# Patient Record
Sex: Female | Born: 1985
Health system: Southern US, Community
[De-identification: ages and names within clinical notes are randomized; demographics above are authoritative.]

## PROBLEM LIST (undated history)

## (undated) DIAGNOSIS — F419 Anxiety disorder, unspecified: Secondary | ICD-10-CM

## (undated) DIAGNOSIS — B019 Varicella without complication: Secondary | ICD-10-CM

## (undated) DIAGNOSIS — I10 Essential (primary) hypertension: Secondary | ICD-10-CM

## (undated) HISTORY — DX: Anxiety disorder, unspecified: F41.9

## (undated) HISTORY — PX: NO PAST SURGERIES: SHX2092

## (undated) HISTORY — DX: Varicella without complication: B01.9

## (undated) HISTORY — DX: Essential (primary) hypertension: I10

---

## 2014-10-20 ENCOUNTER — Emergency Department: Payer: Self-pay | Admitting: Emergency Medicine

## 2016-04-11 ENCOUNTER — Ambulatory Visit (INDEPENDENT_AMBULATORY_CARE_PROVIDER_SITE_OTHER): Payer: BLUE CROSS/BLUE SHIELD

## 2016-04-11 ENCOUNTER — Encounter: Payer: Self-pay | Admitting: Family Medicine

## 2016-04-11 ENCOUNTER — Ambulatory Visit (INDEPENDENT_AMBULATORY_CARE_PROVIDER_SITE_OTHER): Payer: BLUE CROSS/BLUE SHIELD | Admitting: Family Medicine

## 2016-04-11 VITALS — BP 141/91 | HR 100 | Temp 98.1°F | Ht 68.0 in | Wt 257.1 lb

## 2016-04-11 DIAGNOSIS — M5441 Lumbago with sciatica, right side: Secondary | ICD-10-CM | POA: Insufficient documentation

## 2016-04-11 MED ORDER — PREDNISONE 10 MG (48) PO TBPK: ORAL_TABLET | ORAL | 0 refills | Status: DC

## 2016-04-11 NOTE — Patient Instructions (Signed)
Take the medication as prescribed.  We will call with your results.  If you fail to improve, please let me know.  Follow up annually  Take care  Dr. Adriana Simasook

## 2016-04-11 NOTE — Assessment & Plan Note (Signed)
New problem. Obtaining xray and treating with prednisone taper.

## 2016-04-11 NOTE — Progress Notes (Signed)
   Subjective:  Patient ID: Heather Beck, female    DOB: [birth year]  Age: 30 y.o. MRN: [id number]  CC: Low back pain  HPI Heather AdolphSierra Battie is a 30 y.o. female presents to the clinic today as a new patient with the above complaint.  Low back pain  Patient reports a 3 week history of right sided low back pain.  Pain started after lifting at work.   Mild in severity.  She reports associated radiation down her right leg.  She has been treated by a nurse practitioner at her work. She was given a muscle relaxant. She's had little improvement with this. She's taken some over-the-counter ibuprofen without improvement.  She reports that her pain is worse in the morning and it night. Improves with activity.  No other associated symptoms. No other complaints at this time.  PMH, Surgical Hx, Family Hx, Social History reviewed and updated as below.  Past Medical History:  Diagnosis Date  . Chicken pox    Past Surgical History:  Procedure Laterality Date  . NO PAST SURGERIES     Family History  Problem Relation Age of Onset  . Pancreatic cancer Maternal Aunt   . Bladder Cancer Maternal Uncle    Social History  Substance Use Topics  . Smoking status: Current Every Day Smoker  . Smokeless tobacco: Never Used  . Alcohol use Yes   Review of Systems  Musculoskeletal: Positive for back pain.  All other systems reviewed and are negative.  Objective:   Today's Vitals: BP (!) 141/91 (BP Location: Right Arm, Patient Position: Sitting, Cuff Size: Large)   Pulse 100   Temp 98.1 F (36.7 C) (Oral)   Ht 5\' 8"  (1.727 m)   Wt 257 lb 2 oz (116.6 kg)   LMP 03/21/2016 (Approximate)   SpO2 100%   BMI 39.10 kg/m   Physical Exam  Constitutional: She is oriented to person, place, and time. She appears well-developed. No distress.  HENT:  Head: Normocephalic and atraumatic.  Mouth/Throat: Oropharynx is clear and moist.  Eyes: Conjunctivae are normal. No scleral icterus.  Neck: Neck  supple.  Cardiovascular: Normal rate and regular rhythm.   Pulmonary/Chest: Effort normal. She has no wheezes. She has no rales.  Abdominal: Soft. She exhibits no distension. There is no tenderness. There is no rebound and no guarding.  Musculoskeletal:  Low back - nontender to palpation. No evidence of spasm. Negative straight leg raise.  Neurological: She is alert and oriented to person, place, and time.  Skin: Skin is warm and dry. No rash noted.  Psychiatric: She has a normal mood and affect.  Vitals reviewed.  Assessment & Plan:   Problem List Items Addressed This Visit    Right-sided low back pain with right-sided sciatica - Primary    New problem. Obtaining xray and treating with prednisone taper.      Relevant Medications   predniSONE (STERAPRED UNI-PAK 48 TAB) 10 MG (48) TBPK tablet   Other Relevant Orders   DG Lumbar Spine 2-3 Views    Other Visit Diagnoses   None.     Outpatient Encounter Prescriptions as of 04/11/2016  Medication Sig  . predniSONE (STERAPRED UNI-PAK 48 TAB) 10 MG (48) TBPK tablet Per package instructions.   No facility-administered encounter medications on file as of 04/11/2016.     Follow-up: Annually/sooner if needed.   Everlene OtherJayce Esbeidy Mclaine DO [facility] [place]

## 2016-04-11 NOTE — Progress Notes (Signed)
Pre visit review using our clinic review tool, if applicable. No additional management support is needed unless otherwise documented below in the visit note. 

## 2016-05-11 ENCOUNTER — Emergency Department
Admission: EM | Admit: 2016-05-11 | Discharge: 2016-05-11 | Disposition: A | Discharge status: Home / Self Care | Payer: Worker's Compensation | Attending: Emergency Medicine | Admitting: Emergency Medicine

## 2016-05-11 ENCOUNTER — Encounter: Payer: Self-pay | Admitting: Emergency Medicine

## 2016-05-11 DIAGNOSIS — M5441 Lumbago with sciatica, right side: Secondary | ICD-10-CM | POA: Diagnosis not present

## 2016-05-11 DIAGNOSIS — F17200 Nicotine dependence, unspecified, uncomplicated: Secondary | ICD-10-CM | POA: Diagnosis not present

## 2016-05-11 DIAGNOSIS — M5431 Sciatica, right side: Secondary | ICD-10-CM

## 2016-05-11 DIAGNOSIS — M545 Low back pain: Secondary | ICD-10-CM | POA: Diagnosis present

## 2016-05-11 MED ORDER — TRAMADOL HCL 50 MG PO TABS: 50.0000 mg | ORAL_TABLET | Freq: Four times a day (QID) | ORAL | 0 refills | Status: DC | PRN

## 2016-05-11 MED ORDER — CYCLOBENZAPRINE HCL 10 MG PO TABS: 10.0000 mg | ORAL_TABLET | Freq: Three times a day (TID) | ORAL | 0 refills | Status: DC | PRN

## 2016-05-11 MED ORDER — PREDNISONE 10 MG (21) PO TBPK: ORAL_TABLET | ORAL | 0 refills | Status: DC

## 2016-05-11 NOTE — ED Notes (Signed)
Pt stating that she "has really bad lower back pain." Pt was "hurt on the job about a month ago." Pt was prescribed Prednisone. Pt stating that the pain comes and goes. Pt stating that she was in a lot of pain on waking this morning when trying to stand. Pain shoots down right leg. Pt stating that she also experiences pain when turning in bed at night.

## 2016-05-11 NOTE — ED Provider Notes (Signed)
[facility] Emergency Department Provider Note ____________________________________________  Time seen: Approximately 10:04 AM  I have reviewed the triage vital signs and the nursing notes.   HISTORY  Chief Complaint Back Pain    HPI Dow AdolphSierra Giannone is a 30 y.o. female who presents to the emergency department for evaluation of back pain that started about a month ago. Pain increases with ambulation and turning over in bed. Pain radiates down with leg. She was given prednisone about a month ago and had relief of pain until yesterday.  Past Medical History:  Diagnosis Date  . Chicken pox     Patient Active Problem List   Diagnosis Date Noted  . Right-sided low back pain with right-sided sciatica 04/11/2016    Past Surgical History:  Procedure Laterality Date  . NO PAST SURGERIES      Prior to Admission medications   Medication Sig Start Date End Date Taking? Authorizing Provider  cyclobenzaprine (FLEXERIL) 10 MG tablet Take 1 tablet (10 mg total) by mouth 3 (three) times daily as needed for muscle spasms. 05/11/16   Chinita Pesterari B Tayshaun Kroh, FNP  predniSONE (STERAPRED UNI-PAK 21 TAB) 10 MG (21) TBPK tablet Take 6 tablets on day 1 Take 5 tablets on day 2 Take 4 tablets on day 3 Take 3 tablets on day 4 Take 2 tablets on day 5 Take 1 tablet on day 6 05/11/16   Micheala Morissette B Tanyiah Laurich, FNP  traMADol (ULTRAM) 50 MG tablet Take 1 tablet (50 mg total) by mouth every 6 (six) hours as needed. 05/11/16   Chinita Pesterari B Ashlei Chinchilla, FNP    Allergies Review of patient's allergies indicates no known allergies.  Family History  Problem Relation Age of Onset  . Pancreatic cancer Maternal Aunt   . Bladder Cancer Maternal Uncle     Social History Social History  Substance Use Topics  . Smoking status: Current Every Day Smoker  . Smokeless tobacco: Never Used  . Alcohol use Yes    Review of Systems Constitutional: No recent illness. Cardiovascular: Denies chest pain or  palpitations. Respiratory: Denies shortness of breath. Musculoskeletal: Pain in lower back with radiation into right leg. Skin: Negative for rash, wound, lesion. Neurological: Negative for focal weakness or numbness.  ____________________________________________   PHYSICAL EXAM:  VITAL SIGNS: ED Triage Vitals  Enc Vitals Group     BP 05/11/16 0921 140/74     Pulse Rate 05/11/16 0921 94     Resp 05/11/16 0921 18     Temp 05/11/16 0921 98.7 F (37.1 C)     Temp Source 05/11/16 0921 Oral     SpO2 05/11/16 0921 100 %     Weight 05/11/16 0920 250 lb (113.4 kg)     Height 05/11/16 0920 5\' 8"  (1.727 m)     Head Circumference --      Peak Flow --      Pain Score 05/11/16 0920 10     Pain Loc --      Pain Edu? --      Excl. in GC? --     Constitutional: Alert and oriented. Well appearing and in no acute distress. Eyes: Conjunctivae are normal. EOMI. Head: Atraumatic. Neck: No stridor.  Respiratory: Normal respiratory effort.   Musculoskeletal: Full ROM. Straight leg raise positive at 45*.  Neurologic:  Normal speech and language. No gross focal neurologic deficits are appreciated. Speech is normal. No gait instability. Skin:  Skin is warm, dry and intact. Atraumatic. Psychiatric: Mood and affect are normal. Speech  and behavior are normal.  ____________________________________________   LABS (all labs ordered are listed, but only abnormal results are displayed)  Labs Reviewed - No data to display ____________________________________________  RADIOLOGY  Not indicated. ____________________________________________   PROCEDURES  Procedure(s) performed: None   ____________________________________________   INITIAL IMPRESSION / ASSESSMENT AND PLAN / ED COURSE  Clinical Course    Pertinent labs & imaging results that were available during my care of the patient were reviewed by me and considered in my medical decision making (see chart for details).  Patient will  be given Prednisone taper, Flexeril, and tramadol. She was instructed to call her primary care provider on Monday and schedule a follow-up appointment. She was instructed to return to the emergency department for symptoms that change or worsen if unable to schedule an appointment. ____________________________________________   FINAL CLINICAL IMPRESSION(S) / ED DIAGNOSES  Final diagnoses:  Sciatica of right side       Chinita Pester, FNP 05/11/16 1039    Governor Rooks, MD 05/11/16 1418

## 2016-05-11 NOTE — ED Triage Notes (Signed)
Reports hurt back one month ago at work and went to PCP. Was given prednisone and improved but then back started hurting again yesterday. Low back pain worse today.

## 2016-06-04 ENCOUNTER — Telehealth: Payer: Self-pay | Admitting: Family Medicine

## 2016-06-04 ENCOUNTER — Telehealth: Payer: Self-pay | Admitting: *Deleted

## 2016-06-04 NOTE — Telephone Encounter (Signed)
FYI Pt was transferred to the nurse line, she currently has a large amount of blood in her stool. She stated the the blood was bright red

## 2016-06-04 NOTE — Telephone Encounter (Signed)
Heather Beck this patient is on the schedule for tomorrow with [name], thanks

## 2016-06-04 NOTE — Telephone Encounter (Signed)
Your 1030 06/05/16

## 2016-06-04 NOTE — Telephone Encounter (Signed)
°  Patient Name: Heather Beck  DOB: 22-Sep-1985    Initial Comment Caller states she's having large amount of blood in her stool.   Nurse Assessment  Nurse: Renaldo FiddlerAdkins, RN, Raynelle FanningJulie Date/Time Lamount Cohen(Eastern Time): 06/04/2016 9:23:12 AM  Confirm and document reason for call. If symptomatic, describe symptoms. You must click the next button to save text entered. ---Caller states she had blood in her stool on Sunday. She has been very constipated and the stool was very hard.  Has the patient traveled out of the country within the last 30 days? ---Not Applicable  Does the patient have any new or worsening symptoms? ---Yes  Will a triage be completed? ---Yes  Related visit to physician within the last 2 weeks? ---No  Does the PT have any chronic conditions? (i.e. diabetes, asthma, etc.) ---No  Is the patient pregnant or possibly pregnant? (Ask all females between the ages of 6212-55) ---No  Is this a behavioral health or substance abuse call? ---No     Guidelines    Guideline Title Affirmed Question Affirmed Notes  Rectal Bleeding MILD rectal bleeding (more than just a few drops or streaks)    Final Disposition User   See PCP When Office is Open (within 3 days) Renaldo FiddlerAdkins, RN, Raynelle FanningJulie    Referrals  REFERRED TO PCP OFFICE   Disagree/Comply: Danella Maiersomply

## 2016-06-04 NOTE — Telephone Encounter (Signed)
See team health note. 

## 2016-06-05 ENCOUNTER — Ambulatory Visit (INDEPENDENT_AMBULATORY_CARE_PROVIDER_SITE_OTHER): Payer: BLUE CROSS/BLUE SHIELD | Admitting: Family Medicine

## 2016-06-05 ENCOUNTER — Encounter: Payer: Self-pay | Admitting: Family Medicine

## 2016-06-05 VITALS — BP 142/98 | HR 111 | Temp 98.1°F | Resp 16 | Wt 254.0 lb

## 2016-06-05 DIAGNOSIS — K625 Hemorrhage of anus and rectum: Secondary | ICD-10-CM | POA: Insufficient documentation

## 2016-06-05 MED ORDER — POLYETHYLENE GLYCOL 3350 17 GM/SCOOP PO POWD: 17.0000 g | Freq: Two times a day (BID) | ORAL | 1 refills | Status: DC | PRN

## 2016-06-05 NOTE — Patient Instructions (Signed)
Take the miralax at least once daily.  Follow up annually or sooner if needed.  Take care  Dr. Adriana Simasook

## 2016-06-05 NOTE — Progress Notes (Signed)
   Subjective:  Patient ID: Heather AdolphSierra Beck, female    DOB: 1986-02-03  Age: 30 y.o. MRN: [id number]  CC: Blood in stool  HPI:  30 year old female presents with the above complaint.  Patient states that approximately 2 weeks ago she had severe constipation. She sat on the toilet for hours attempting to pass stool. She had a large, hard bowel movement and had associated blood. She states that it was a minimal amount of blood, approximately a tablespoon. Since that time she's used suppositories and enemas without significant improvement. She has had several bowel movements since that period of time without blood. More recently, on Monday she had to strain for bowel movement and had some red blood again. She had a normal bowel movement today without significant straining. No associated abdominal pain. No reports of hemorrhoids. No other associated symptoms. No other complaints this time.  Social Hx   Social History   Social History  . Marital status: Single    Spouse name: N/A  . Number of children: N/A  . Years of education: N/A   Social History Main Topics  . Smoking status: Current Every Day Smoker  . Smokeless tobacco: Never Used  . Alcohol use Yes  . Drug use: No  . Sexual activity: Not Currently   Other Topics Concern  . None   Social History Narrative  . None    Review of Systems  Constitutional: Negative.   Gastrointestinal: Positive for blood in stool and constipation.   Objective:  BP (!) 142/98 (BP Location: Right Arm, Patient Position: Sitting, Cuff Size: Large)   Pulse (!) 111   Temp 98.1 F (36.7 C) (Oral)   Resp 16   Wt 254 lb (115.2 kg)   LMP 04/21/2016   SpO2 98%   BMI 38.62 kg/m   BP/Weight 06/05/2016 05/11/2016 04/11/2016  Systolic BP 142 142 141  Diastolic BP 98 68 91  Wt. (Lbs) 254 250 257.13  BMI 38.62 38.01 39.1    Physical Exam  Constitutional: She is oriented to person, place, and time. She appears well-developed. No distress.    Pulmonary/Chest: Effort normal.  Abdominal: Soft. She exhibits no distension. There is no tenderness.  Genitourinary: Rectal exam shows no external hemorrhoid and no fissure.  Neurological: She is alert and oriented to person, place, and time.  Psychiatric: She has a normal mood and affect.  Vitals reviewed.  Assessment & Plan:   Problem List Items Addressed This Visit    Rectal bleeding - Primary    New problem. Minimal amount of bleeding associated with constipation. Reassurance provided. Advised increased fiber intake. MiraLAX as prescribed.       Other Visit Diagnoses   None.    Meds ordered this encounter  Medications  . polyethylene glycol powder (GLYCOLAX/MIRALAX) powder    Sig: Take 17 g by mouth 2 (two) times daily as needed.    Dispense:  500 g    Refill:  1   Follow-up: PRN  Everlene OtherJayce Frances Ambrosino DO [facility] [place]

## 2016-06-05 NOTE — Assessment & Plan Note (Signed)
New problem. Minimal amount of bleeding associated with constipation. Reassurance provided. Advised increased fiber intake. MiraLAX as prescribed.

## 2018-02-20 DIAGNOSIS — R030 Elevated blood-pressure reading, without diagnosis of hypertension: Secondary | ICD-10-CM | POA: Diagnosis not present

## 2018-02-20 DIAGNOSIS — F410 Panic disorder [episodic paroxysmal anxiety] without agoraphobia: Secondary | ICD-10-CM | POA: Diagnosis not present

## 2018-02-20 DIAGNOSIS — Z8349 Family history of other endocrine, nutritional and metabolic diseases: Secondary | ICD-10-CM | POA: Diagnosis not present

## 2018-02-26 DIAGNOSIS — R740 Nonspecific elevation of levels of transaminase and lactic acid dehydrogenase [LDH]: Secondary | ICD-10-CM | POA: Diagnosis not present

## 2018-05-27 DIAGNOSIS — L308 Other specified dermatitis: Secondary | ICD-10-CM | POA: Diagnosis not present

## 2018-05-27 DIAGNOSIS — L430 Hypertrophic lichen planus: Secondary | ICD-10-CM | POA: Diagnosis not present

## 2018-07-06 DIAGNOSIS — L02412 Cutaneous abscess of left axilla: Secondary | ICD-10-CM | POA: Diagnosis not present

## 2018-07-07 DIAGNOSIS — L439 Lichen planus, unspecified: Secondary | ICD-10-CM | POA: Diagnosis not present

## 2018-07-07 DIAGNOSIS — R21 Rash and other nonspecific skin eruption: Secondary | ICD-10-CM | POA: Diagnosis not present

## 2018-10-23 DIAGNOSIS — R05 Cough: Secondary | ICD-10-CM | POA: Diagnosis not present

## 2019-05-03 ENCOUNTER — Encounter: Payer: Self-pay | Admitting: Adult Health

## 2019-05-03 ENCOUNTER — Ambulatory Visit: Payer: BC Managed Care – PPO | Admitting: Adult Health

## 2019-05-03 ENCOUNTER — Other Ambulatory Visit: Payer: Self-pay

## 2019-05-03 VITALS — BP 148/98 | HR 94 | Resp 16 | Ht 67.5 in | Wt 246.6 lb

## 2019-05-03 DIAGNOSIS — F419 Anxiety disorder, unspecified: Secondary | ICD-10-CM | POA: Diagnosis not present

## 2019-05-03 DIAGNOSIS — R030 Elevated blood-pressure reading, without diagnosis of hypertension: Secondary | ICD-10-CM | POA: Diagnosis not present

## 2019-05-03 NOTE — Progress Notes (Signed)
[facility] [place], [place] [address]  Internal MEDICINE  Office Visit Note  Patient Name: Heather Beck  [id number]  [id number]  Date of Service: 05/03/2019   Complaints/HPI Pt is here for establishment of PCP. Chief Complaint  Patient presents with  . Medical Management of Chronic Issues    new patient establish care   . Anxiety    bp is elevated   . Quality Metric Gaps    physical and pap needed   HPI Pt is here to establish care.  She reports she moved here from [place] 5 years ago and has yet to establish with a provider in the area.  She works for [employer] center and see the occupational health NP there for any issues.  She feels like it is time to finally establish care outside of that.  She was recently diagnosed with anxiety and started Zoloft a few days ago. Pt is in need a physical and pap which will be scheduled at this visit.   Current Medication: Outpatient Encounter Medications as of 05/03/2019  Medication Sig  . sertraline (ZOLOFT) 25 MG tablet Take 25 mg by mouth daily.   . [DISCONTINUED] polyethylene glycol powder (GLYCOLAX/MIRALAX) powder Take 17 g by mouth 2 (two) times daily as needed. (Patient not taking: Reported on 05/03/2019)   No facility-administered encounter medications on file as of 05/03/2019.     Surgical History: Past Surgical History:  Procedure Laterality Date  . NO PAST SURGERIES      Medical History: Past Medical History:  Diagnosis Date  . Anxiety   . Chicken pox     Family History: Family History  Problem Relation Age of Onset  . Pancreatic cancer Maternal Aunt   . Bladder Cancer Maternal Uncle     Social History   Socioeconomic History  . Marital status: Single    Spouse name: Not on file  . Number of children: Not on file  . Years of education: Not on file  . Highest education level: Not on file  Occupational History  . Not on file  Social Needs  . Financial resource strain:  Not on file  . Food insecurity    Worry: Not on file    Inability: Not on file  . Transportation needs    Medical: Not on file    Non-medical: Not on file  Tobacco Use  . Smoking status: Former Smoker    Types: Cigarettes    Quit date: 05/02/2017    Years since quitting: 2.0  . Smokeless tobacco: Never Used  Substance and Sexual Activity  . Alcohol use: Yes  . Drug use: No  . Sexual activity: Not Currently  Lifestyle  . Physical activity    Days per week: Not on file    Minutes per session: Not on file  . Stress: Not on file  Relationships  . Social Herbalist on phone: Not on file    Gets together: Not on file    Attends religious service: Not on file    Active member of club or organization: Not on file    Attends meetings of clubs or organizations: Not on file    Relationship status: Not on file  . Intimate partner violence    Fear of current or ex partner: Not on file    Emotionally abused: Not on file    Physically abused: Not on file    Forced sexual activity: Not on file  Other Topics  Concern  . Not on file  Social History Narrative  . Not on file     Review of Systems  Constitutional: Negative for chills, fatigue and unexpected weight change.  HENT: Negative for congestion, rhinorrhea, sneezing and sore throat.   Eyes: Negative for photophobia, pain and redness.  Respiratory: Negative for cough, chest tightness and shortness of breath.   Cardiovascular: Negative for chest pain and palpitations.  Gastrointestinal: Negative for abdominal pain, constipation, diarrhea, nausea and vomiting.  Endocrine: Negative.   Genitourinary: Negative for dysuria and frequency.  Musculoskeletal: Negative for arthralgias, back pain, joint swelling and neck pain.  Skin: Negative for rash.  Allergic/Immunologic: Negative.   Neurological: Negative for tremors and numbness.  Hematological: Negative for adenopathy. Does not bruise/bleed easily.  Psychiatric/Behavioral:  Negative for behavioral problems and sleep disturbance. The patient is not nervous/anxious.     Vital Signs: BP (!) 148/98   Pulse 94   Resp 16   Ht 5' 7.5" (1.715 m)   Wt 246 lb 9.6 oz (111.9 kg)   SpO2 98%   BMI 38.05 kg/m    Physical Exam Vitals signs and nursing note reviewed.  Constitutional:      General: She is not in acute distress.    Appearance: She is well-developed. She is not diaphoretic.  HENT:     Head: Normocephalic and atraumatic.     Mouth/Throat:     Pharynx: No oropharyngeal exudate.  Eyes:     Pupils: Pupils are equal, round, and reactive to light.  Neck:     Musculoskeletal: Normal range of motion and neck supple.     Thyroid: No thyromegaly.     Vascular: No JVD.     Trachea: No tracheal deviation.  Cardiovascular:     Rate and Rhythm: Normal rate and regular rhythm.     Heart sounds: Normal heart sounds. No murmur. No friction rub. No gallop.   Pulmonary:     Effort: Pulmonary effort is normal. No respiratory distress.     Breath sounds: Normal breath sounds. No wheezing or rales.  Chest:     Chest wall: No tenderness.  Abdominal:     Palpations: Abdomen is soft.     Tenderness: There is no abdominal tenderness. There is no guarding.  Musculoskeletal: Normal range of motion.  Lymphadenopathy:     Cervical: No cervical adenopathy.  Skin:    General: Skin is warm and dry.  Neurological:     Mental Status: She is alert and oriented to person, place, and time.     Cranial Nerves: No cranial nerve deficit.  Psychiatric:        Behavior: Behavior normal.        Thought Content: Thought content normal.        Judgment: Judgment normal.    Assessment/Plan: 1. Elevated BP without diagnosis of hypertension Slightly elevated today, will monitor at next visit.   2. Anxiety Stable, continue current therapy.  General Counseling: [name] verbalizes understanding of the findings of todays visit and agrees with plan of treatment. I have discussed  any further diagnostic evaluation that may be needed or ordered today. We also reviewed her medications today. she has been encouraged to call the office with any questions or concerns that should arise related to todays visit.  No orders of the defined types were placed in this encounter.   No orders of the defined types were placed in this encounter.   Time spent: 30 Minutes   This patient was  seen by Blima LedgerAdam Atiya Yera AGNP-C in Collaboration with Dr Lyndon CodeFozia M Khan as a part of collaborative care agreement  Johnna AcostaAdam J. Armonte Tortorella AGNP-C Internal Medicine

## 2019-05-07 ENCOUNTER — Encounter: Payer: Self-pay | Admitting: Adult Health

## 2019-06-11 ENCOUNTER — Other Ambulatory Visit: Payer: Self-pay | Admitting: Adult Health

## 2019-06-11 DIAGNOSIS — Z0001 Encounter for general adult medical examination with abnormal findings: Secondary | ICD-10-CM | POA: Diagnosis not present

## 2019-06-12 LAB — COMPREHENSIVE METABOLIC PANEL
ALT: 34 IU/L — ABNORMAL HIGH (ref 0–32)
AST: 35 IU/L (ref 0–40)
Albumin/Globulin Ratio: 1.5 (ref 1.2–2.2)
Albumin: 4.4 g/dL (ref 3.8–4.8)
Alkaline Phosphatase: 104 IU/L (ref 39–117)
BUN/Creatinine Ratio: 14 (ref 9–23)
BUN: 10 mg/dL (ref 6–20)
Bilirubin Total: 0.4 mg/dL (ref 0.0–1.2)
CO2: 20 mmol/L (ref 20–29)
Calcium: 9.2 mg/dL (ref 8.7–10.2)
Chloride: 101 mmol/L (ref 96–106)
Creatinine, Ser: 0.71 mg/dL (ref 0.57–1.00)
GFR calc Af Amer: 129 mL/min/{1.73_m2} (ref >59)
GFR calc non Af Amer: 111 mL/min/{1.73_m2} (ref >59)
Globulin, Total: 2.9 g/dL (ref 1.5–4.5)
Glucose: 86 mg/dL (ref 65–99)
Potassium: 4.5 mmol/L (ref 3.5–5.2)
Sodium: 138 mmol/L (ref 134–144)
Total Protein: 7.3 g/dL (ref 6.0–8.5)

## 2019-06-12 LAB — CBC WITH DIFFERENTIAL/PLATELET
Basophils Absolute: 0 10*3/uL (ref 0.0–0.2)
Basos: 1 %
EOS (ABSOLUTE): 0.4 10*3/uL (ref 0.0–0.4)
Eos: 5 %
Hematocrit: 38.3 % (ref 34.0–46.6)
Hemoglobin: 13.1 g/dL (ref 11.1–15.9)
Immature Grans (Abs): 0 10*3/uL (ref 0.0–0.1)
Immature Granulocytes: 0 %
Lymphocytes Absolute: 2.9 10*3/uL (ref 0.7–3.1)
Lymphs: 35 %
MCH: 29.6 pg (ref 26.6–33.0)
MCHC: 34.2 g/dL (ref 31.5–35.7)
MCV: 87 fL (ref 79–97)
Monocytes Absolute: 0.6 10*3/uL (ref 0.1–0.9)
Monocytes: 8 %
Neutrophils Absolute: 4.3 10*3/uL (ref 1.4–7.0)
Neutrophils: 51 %
Platelets: 337 10*3/uL (ref 150–450)
RBC: 4.43 x10E6/uL (ref 3.77–5.28)
RDW: 13 % (ref 11.7–15.4)
WBC: 8.3 10*3/uL (ref 3.4–10.8)

## 2019-06-12 LAB — T4, FREE: Free T4: 1.05 ng/dL (ref 0.82–1.77)

## 2019-06-12 LAB — LIPID PANEL WITH LDL/HDL RATIO
Cholesterol, Total: 208 mg/dL — ABNORMAL HIGH (ref 100–199)
HDL: 91 mg/dL (ref >39)
LDL Chol Calc (NIH): 100 mg/dL — ABNORMAL HIGH (ref 0–99)
LDL/HDL Ratio: 1.1 ratio (ref 0.0–3.2)
Triglycerides: 100 mg/dL (ref 0–149)
VLDL Cholesterol Cal: 17 mg/dL (ref 5–40)

## 2019-06-12 LAB — TSH: TSH: 1.42 u[IU]/mL (ref 0.450–4.500)

## 2019-06-15 ENCOUNTER — Other Ambulatory Visit: Payer: Self-pay

## 2019-06-15 ENCOUNTER — Ambulatory Visit (INDEPENDENT_AMBULATORY_CARE_PROVIDER_SITE_OTHER): Payer: BC Managed Care – PPO | Admitting: Adult Health

## 2019-06-15 ENCOUNTER — Encounter: Payer: Self-pay | Admitting: Adult Health

## 2019-06-15 VITALS — BP 140/90 | HR 93 | Temp 97.3°F | Resp 16 | Ht 68.0 in | Wt 243.0 lb

## 2019-06-15 DIAGNOSIS — Z112 Encounter for screening for other bacterial diseases: Secondary | ICD-10-CM | POA: Diagnosis not present

## 2019-06-15 DIAGNOSIS — F419 Anxiety disorder, unspecified: Secondary | ICD-10-CM | POA: Diagnosis not present

## 2019-06-15 DIAGNOSIS — I10 Essential (primary) hypertension: Secondary | ICD-10-CM | POA: Diagnosis not present

## 2019-06-15 DIAGNOSIS — Z0001 Encounter for general adult medical examination with abnormal findings: Secondary | ICD-10-CM | POA: Diagnosis not present

## 2019-06-15 DIAGNOSIS — Z124 Encounter for screening for malignant neoplasm of cervix: Secondary | ICD-10-CM

## 2019-06-15 DIAGNOSIS — R300 Dysuria: Secondary | ICD-10-CM | POA: Diagnosis not present

## 2019-06-15 DIAGNOSIS — M79602 Pain in left arm: Secondary | ICD-10-CM

## 2019-06-15 MED ORDER — SERTRALINE HCL 25 MG PO TABS: 25.0000 mg | ORAL_TABLET | Freq: Every day | ORAL | 2 refills | Status: DC

## 2019-06-15 MED ORDER — AMLODIPINE BESYLATE 5 MG PO TABS: 5.0000 mg | ORAL_TABLET | Freq: Every day | ORAL | 1 refills | Status: DC

## 2019-06-15 NOTE — Progress Notes (Signed)
[facility] [address] [place], [place] [address]  Internal MEDICINE  Office Visit Note  Patient Name: Heather Beck  [id number]  [id number]  Date of Service: 06/15/2019  Chief Complaint  Patient presents with  . Annual Exam  . Shoulder Pain    left   . Anxiety     HPI Pt is here for routine health maintenance examination.  PT reports overall she is doing well.  Her blood pressure is better today in office.  Her job has been checking her bp and over the last month she has recorded 120/82, 130/82,128/82,130/88,128/80,132/84.  She also reports left shoulder pain that is intermittent over the last few months. She describes it as sharp pain that does not radiate.  However, it has hurt in her elbow, at times.  She also has a history of anxiety.  She reports that for a month she has been on zoloft, and feels like she is able to calm herself down, and feels less anxious.      Current Medication: Outpatient Encounter Medications as of 06/15/2019  Medication Sig  . sertraline (ZOLOFT) 25 MG tablet Take 25 mg by mouth daily.    No facility-administered encounter medications on file as of 06/15/2019.     Surgical History: Past Surgical History:  Procedure Laterality Date  . NO PAST SURGERIES      Medical History: Past Medical History:  Diagnosis Date  . Anxiety   . Chicken pox     Family History: Family History  Problem Relation Age of Onset  . Pancreatic cancer Maternal Aunt   . Bladder Cancer Maternal Uncle       Review of Systems  Constitutional: Negative for chills, fatigue and unexpected weight change.  HENT: Negative for congestion, rhinorrhea, sneezing and sore throat.   Eyes: Negative for photophobia, pain and redness.  Respiratory: Negative for cough, chest tightness and shortness of breath.   Cardiovascular: Negative for chest pain and palpitations.  Gastrointestinal: Negative for abdominal pain, constipation, diarrhea, nausea and vomiting.   Endocrine: Negative.   Genitourinary: Negative for dysuria and frequency.  Musculoskeletal: Negative for arthralgias, back pain, joint swelling and neck pain.  Skin: Negative for rash.  Allergic/Immunologic: Negative.   Neurological: Negative for tremors and numbness.  Hematological: Negative for adenopathy. Does not bruise/bleed easily.  Psychiatric/Behavioral: Negative for behavioral problems and sleep disturbance. The patient is not nervous/anxious.      Vital Signs: BP 140/90   Pulse 93   Temp (!) 97.3 F (36.3 C)   Resp 16   Ht 5\' 8"  (1.727 m)   Wt 243 lb (110.2 kg)   SpO2 98%   BMI 36.95 kg/m    Physical Exam Chest:     Breasts:        Right: Normal.        Left: Normal.     Comments: Exam Chaperoned by CMA     LABS: Recent Results (from the past 2160 hour(s))  CBC with Differential/Platelet     Status: None   Collection Time: 06/11/19  2:02 PM  Result Value Ref Range   WBC 8.3 3.4 - 10.8 x10E3/uL   RBC 4.43 3.77 - 5.28 x10E6/uL   Hemoglobin 13.1 11.1 - 15.9 g/dL   Hematocrit 13/06/20 03.5 - 46.6 %   MCV 87 79 - 97 fL   MCH 29.6 26.6 - 33.0 pg   MCHC 34.2 31.5 - 35.7 g/dL   RDW 59.7 41.6 - 38.4 %   Platelets  337 150 - 450 x10E3/uL   Neutrophils 51 Not Estab. %   Lymphs 35 Not Estab. %   Monocytes 8 Not Estab. %   Eos 5 Not Estab. %   Basos 1 Not Estab. %   Neutrophils Absolute 4.3 1.4 - 7.0 x10E3/uL   Lymphocytes Absolute 2.9 0.7 - 3.1 x10E3/uL   Monocytes Absolute 0.6 0.1 - 0.9 x10E3/uL   EOS (ABSOLUTE) 0.4 0.0 - 0.4 x10E3/uL   Basophils Absolute 0.0 0.0 - 0.2 x10E3/uL   Immature Granulocytes 0 Not Estab. %   Immature Grans (Abs) 0.0 0.0 - 0.1 x10E3/uL  Comprehensive metabolic panel     Status: Abnormal   Collection Time: 06/11/19  2:02 PM  Result Value Ref Range   Glucose 86 65 - 99 mg/dL   BUN 10 6 - 20 mg/dL   Creatinine, Ser 1.610.71 0.57 - 1.00 mg/dL   GFR calc non Af Amer 111 >59 mL/min/1.73   GFR calc Af Amer 129 >59 mL/min/1.73    BUN/Creatinine Ratio 14 9 - 23   Sodium 138 134 - 144 mmol/L   Potassium 4.5 3.5 - 5.2 mmol/L   Chloride 101 96 - 106 mmol/L   CO2 20 20 - 29 mmol/L   Calcium 9.2 8.7 - 10.2 mg/dL   Total Protein 7.3 6.0 - 8.5 g/dL   Albumin 4.4 3.8 - 4.8 g/dL   Globulin, Total 2.9 1.5 - 4.5 g/dL   Albumin/Globulin Ratio 1.5 1.2 - 2.2   Bilirubin Total 0.4 0.0 - 1.2 mg/dL   Alkaline Phosphatase 104 39 - 117 IU/L   AST 35 0 - 40 IU/L   ALT 34 (H) 0 - 32 IU/L  Lipid Panel With LDL/HDL Ratio     Status: Abnormal   Collection Time: 06/11/19  2:02 PM  Result Value Ref Range   Cholesterol, Total 208 (H) 100 - 199 mg/dL   Triglycerides 096100 0 - 149 mg/dL   HDL 91 >04>39 mg/dL   VLDL Cholesterol Cal 17 5 - 40 mg/dL   LDL Chol Calc (NIH) 540100 (H) 0 - 99 mg/dL   LDL/HDL Ratio 1.1 0.0 - 3.2 ratio    Comment:                                     LDL/HDL Ratio                                             Men  Women                               1/2 Avg.Risk  1.0    1.5                                   Avg.Risk  3.6    3.2                                2X Avg.Risk  6.2    5.0  3X Avg.Risk  8.0    6.1   T4, free     Status: None   Collection Time: 06/11/19  2:02 PM  Result Value Ref Range   Free T4 1.05 0.82 - 1.77 ng/dL  TSH     Status: None   Collection Time: 06/11/19  2:02 PM  Result Value Ref Range   TSH 1.420 0.450 - 4.500 uIU/mL    Assessment/Plan: 1. Encounter for general adult medical examination with abnormal findings Up to date on PHM.  2. Essential hypertension Start Norvasc as discussed.  - amLODipine (NORVASC) 5 MG tablet; Take 1 tablet (5 mg total) by mouth daily.  Dispense: 30 tablet; Refill: 1  3. Anxiety Continue Zoloft as before.  - sertraline (ZOLOFT) 25 MG tablet; Take 1 tablet (25 mg total) by mouth daily.  Dispense: 30 tablet; Refill: 2  4. Morbid obesity (HCC) Obesity Counseling: Risk Assessment: An assessment of behavioral risk factors was  made today and includes lack of exercise sedentary lifestyle, lack of portion control and poor dietary habits.  Risk Modification Advice: She was counseled on portion control guidelines. Restricting daily caloric intake to. . The detrimental long term effects of obesity on her health and ongoing poor compliance was also discussed with the patient.  5. Routine cervical smear - Pap IG and HPV (high risk) DNA detection  6. Dysuria - UA/M w/rflx Culture, Routine( nimi)  7. Left arm pain Likely due to stress/HTN.  Will treat bp, and instructed patient if the intermittent pain continues she should return to clinic.   8. Encounter for screening for other bacterial disease - NuSwab Vaginitis Plus (VG+)  General Counseling: [place] verbalizes understanding of the findings of todays visit and agrees with plan of treatment. I have discussed any further diagnostic evaluation that may be needed or ordered today. We also reviewed her medications today. she has been encouraged to call the office with any questions or concerns that should arise related to todays visit.   Orders Placed This Encounter  Procedures  . UA/M w/rflx Culture, Routine( nimi)    No orders of the defined types were placed in this encounter.   Time spent: 30 Minutes   This patient was seen by Orson Gear AGNP-C in Collaboration with Dr Lavera Guise as a part of collaborative care agreement    Kendell Bane AGNP-C Internal Medicine

## 2019-06-16 LAB — MICROSCOPIC EXAMINATION
Casts: NONE SEEN /lpf
Epithelial Cells (non renal): >10 /hpf — AB (ref 0–10)

## 2019-06-16 LAB — UA/M W/RFLX CULTURE, ROUTINE
Bilirubin, UA: NEGATIVE
Glucose, UA: NEGATIVE
Ketones, UA: NEGATIVE
Leukocytes,UA: NEGATIVE
Nitrite, UA: NEGATIVE
Protein,UA: NEGATIVE
RBC, UA: NEGATIVE
Specific Gravity, UA: 1.024 (ref 1.005–1.030)
Urobilinogen, Ur: 1 mg/dL (ref 0.2–1.0)
pH, UA: 5.5 (ref 5.0–7.5)

## 2019-06-20 LAB — NUSWAB VAGINITIS PLUS (VG+)
Candida albicans, NAA: NEGATIVE
Candida glabrata, NAA: NEGATIVE
Chlamydia trachomatis, NAA: NEGATIVE
Neisseria gonorrhoeae, NAA: NEGATIVE
Trich vag by NAA: NEGATIVE

## 2019-06-23 LAB — PAP IG AND HPV HIGH-RISK: HPV, high-risk: NEGATIVE

## 2019-07-22 ENCOUNTER — Telehealth: Payer: Self-pay

## 2019-07-22 NOTE — Telephone Encounter (Signed)
Called lmom informing patient of appointment. klh 

## 2019-07-26 ENCOUNTER — Other Ambulatory Visit: Payer: Self-pay

## 2019-07-26 ENCOUNTER — Ambulatory Visit: Payer: BC Managed Care – PPO | Admitting: Adult Health

## 2019-07-26 ENCOUNTER — Encounter: Payer: Self-pay | Admitting: Adult Health

## 2019-07-26 VITALS — BP 152/94 | HR 95 | Resp 16 | Ht 68.0 in | Wt 243.0 lb

## 2019-07-26 DIAGNOSIS — F419 Anxiety disorder, unspecified: Secondary | ICD-10-CM | POA: Diagnosis not present

## 2019-07-26 DIAGNOSIS — I10 Essential (primary) hypertension: Secondary | ICD-10-CM | POA: Diagnosis not present

## 2019-07-26 DIAGNOSIS — M542 Cervicalgia: Secondary | ICD-10-CM

## 2019-07-26 NOTE — Progress Notes (Signed)
[facility] [address] [place], [place] [address]  Internal MEDICINE  Office Visit Note  Patient Name: Heather AdolphSierra Beck  [id number]  [id number]  Date of Service: 07/26/2019  Chief Complaint  Patient presents with  . Anxiety    HPI Pt is here for follow up on Anxiety and HTN.  She reports continued control over her anxiety using zoloft.  She feels good, and does not need to increase the medication at this time.  Her bp is slightly elevated today at 152/94.  She was prescribed amlodipine 5mg  tablets at last visit, but has  Not started taking them at this time. She Denies Chest pain, Shortness of breath, palpitations, headache, or blurred vision.  She continues to report some neck and left shoulder pain that she describes as numbness/tingling feeling.  It also has been a sharp pain in her shoulder.  When this happens it can last for hours.         Current Medication: Outpatient Encounter Medications as of 07/26/2019  Medication Sig  . amLODipine (NORVASC) 5 MG tablet Take 1 tablet (5 mg total) by mouth daily.  . sertraline (ZOLOFT) 25 MG tablet Take 1 tablet (25 mg total) by mouth daily.   No facility-administered encounter medications on file as of 07/26/2019.    Surgical History: Past Surgical History:  Procedure Laterality Date  . NO PAST SURGERIES      Medical History: Past Medical History:  Diagnosis Date  . Anxiety   . Chicken pox     Family History: Family History  Problem Relation Age of Onset  . Pancreatic cancer Maternal Aunt   . Bladder Cancer Maternal Uncle     Social History   Socioeconomic History  . Marital status: Single    Spouse name: Not on file  . Number of children: Not on file  . Years of education: Not on file  . Highest education level: Not on file  Occupational History  . Not on file  Tobacco Use  . Smoking status: Former Smoker    Types: Cigarettes    Quit date: 05/02/2017    Years since quitting: 2.2  . Smokeless  tobacco: Never Used  Substance and Sexual Activity  . Alcohol use: Yes  . Drug use: No  . Sexual activity: Not Currently  Other Topics Concern  . Not on file  Social History Narrative  . Not on file   Social Determinants of Health   Financial Resource Strain:   . Difficulty of Paying Living Expenses: Not on file  Food Insecurity:   . Worried About Programme researcher, broadcasting/film/videounning Out of Food in the Last Year: Not on file  . Ran Out of Food in the Last Year: Not on file  Transportation Needs:   . Lack of Transportation (Medical): Not on file  . Lack of Transportation (Non-Medical): Not on file  Physical Activity:   . Days of Exercise per Week: Not on file  . Minutes of Exercise per Session: Not on file  Stress:   . Feeling of Stress : Not on file  Social Connections:   . Frequency of Communication with Friends and Family: Not on file  . Frequency of Social Gatherings with Friends and Family: Not on file  . Attends Religious Services: Not on file  . Active Member of Clubs or Organizations: Not on file  . Attends BankerClub or Organization Meetings: Not on file  . Marital Status: Not on file  Intimate Partner Violence:   . Fear of Current  or Ex-Partner: Not on file  . Emotionally Abused: Not on file  . Physically Abused: Not on file  . Sexually Abused: Not on file      Review of Systems  Constitutional: Negative for chills, fatigue and unexpected weight change.  HENT: Negative for congestion, rhinorrhea, sneezing and sore throat.   Eyes: Negative for photophobia, pain and redness.  Respiratory: Negative for cough, chest tightness and shortness of breath.   Cardiovascular: Negative for chest pain and palpitations.  Gastrointestinal: Negative for abdominal pain, constipation, diarrhea, nausea and vomiting.  Endocrine: Negative.   Genitourinary: Negative for dysuria and frequency.  Musculoskeletal: Negative for arthralgias, back pain, joint swelling and neck pain.  Skin: Negative for rash.   Allergic/Immunologic: Negative.   Neurological: Negative for tremors and numbness.  Hematological: Negative for adenopathy. Does not bruise/bleed easily.  Psychiatric/Behavioral: Negative for behavioral problems and sleep disturbance. The patient is not nervous/anxious.     Vital Signs: BP (!) 152/94   Pulse 95   Resp 16   Ht 5\' 8"  (1.727 m)   Wt 243 lb (110.2 kg)   SpO2 99%   BMI 36.95 kg/m    Physical Exam Vitals and nursing note reviewed.  Constitutional:      General: She is not in acute distress.    Appearance: She is well-developed. She is not diaphoretic.  HENT:     Head: Normocephalic and atraumatic.     Mouth/Throat:     Pharynx: No oropharyngeal exudate.  Eyes:     Pupils: Pupils are equal, round, and reactive to light.  Neck:     Thyroid: No thyromegaly.     Vascular: No JVD.     Trachea: No tracheal deviation.  Cardiovascular:     Rate and Rhythm: Normal rate and regular rhythm.     Heart sounds: Normal heart sounds. No murmur. No friction rub. No gallop.   Pulmonary:     Effort: Pulmonary effort is normal. No respiratory distress.     Breath sounds: Normal breath sounds. No wheezing or rales.  Chest:     Chest wall: No tenderness.  Abdominal:     Palpations: Abdomen is soft.     Tenderness: There is no abdominal tenderness. There is no guarding.  Musculoskeletal:        General: Normal range of motion.     Cervical back: Normal range of motion and neck supple.  Lymphadenopathy:     Cervical: No cervical adenopathy.  Skin:    General: Skin is warm and dry.  Neurological:     Mental Status: She is alert and oriented to person, place, and time.     Cranial Nerves: No cranial nerve deficit.  Psychiatric:        Behavior: Behavior normal.        Thought Content: Thought content normal.        Judgment: Judgment normal.     Assessment/Plan: 1. Neck pain C Spine film ordered.  - DG Cervical Spine Complete; Future  2. Essential  hypertension Encouraged patient to start amlodipine, and follow up in office in 2 weeks.  She should take her bp at work and keep a log as before. IF bp remains high after starting amlodipine, she may increase dose to 10mg .   3. Anxiety Well controlled, continue present management with zoloft.   4. Morbid obesity (HCC) Obesity Counseling: Risk Assessment: An assessment of behavioral risk factors was made today and includes lack of exercise sedentary lifestyle, lack of portion  control and poor dietary habits.  Risk Modification Advice: She was counseled on portion control guidelines. Restricting daily caloric intake to 1600. The detrimental long term effects of obesity on her health and ongoing poor compliance was also discussed with the patient.    General Counseling: [place] verbalizes understanding of the findings of todays visit and agrees with plan of treatment. I have discussed any further diagnostic evaluation that may be needed or ordered today. We also reviewed her medications today. she has been encouraged to call the office with any questions or concerns that should arise related to todays visit.    No orders of the defined types were placed in this encounter.   No orders of the defined types were placed in this encounter.   Time spent: 25 Minutes   This patient was seen by Orson Gear AGNP-C in Collaboration with Dr Lavera Guise as a part of collaborative care agreement     Kendell Bane AGNP-C Internal medicine

## 2019-08-04 ENCOUNTER — Telehealth: Payer: Self-pay

## 2019-08-04 NOTE — Telephone Encounter (Signed)
Called lmom informing appointment with patient. klh

## 2019-08-09 ENCOUNTER — Ambulatory Visit
Admission: RE | Admit: 2019-08-09 | Discharge: 2019-08-09 | Disposition: A | Discharge status: Home / Self Care | Payer: BC Managed Care – PPO | Source: Ambulatory Visit | Attending: Adult Health | Admitting: Adult Health

## 2019-08-09 ENCOUNTER — Ambulatory Visit: Payer: BC Managed Care – PPO | Admitting: Adult Health

## 2019-08-09 ENCOUNTER — Encounter: Payer: Self-pay | Admitting: Adult Health

## 2019-08-09 ENCOUNTER — Other Ambulatory Visit: Payer: Self-pay

## 2019-08-09 VITALS — BP 150/94 | HR 96 | Temp 97.5°F | Resp 16 | Ht 68.0 in | Wt 246.0 lb

## 2019-08-09 DIAGNOSIS — R202 Paresthesia of skin: Secondary | ICD-10-CM | POA: Diagnosis not present

## 2019-08-09 DIAGNOSIS — M542 Cervicalgia: Secondary | ICD-10-CM

## 2019-08-09 DIAGNOSIS — I10 Essential (primary) hypertension: Secondary | ICD-10-CM

## 2019-08-09 DIAGNOSIS — F419 Anxiety disorder, unspecified: Secondary | ICD-10-CM

## 2019-08-09 MED ORDER — AMLODIPINE BESYLATE 5 MG PO TABS: 5.0000 mg | ORAL_TABLET | Freq: Every day | ORAL | 0 refills | Status: DC

## 2019-08-09 NOTE — Progress Notes (Signed)
[facility] [address] [place], [place] [address]  Internal MEDICINE  Office Visit Note  Patient Name: Heather Beck  [id number]  [id number]  Date of Service: 08/09/2019  Chief Complaint  Patient presents with  . Anxiety  . Hypertension    HPI Patient is here today to follow-up on her hypertension. Was started on amlodipine 5 mg daily in November, recently brought in BP log from taking it at work and BP's were stable at that time. Today BP is still elevated 150/94, was unable to bring a BP log today due to the person checking her BP at work has not been there recently. Discussed possibly "white coat syndrome" being the cause of her elevated BP values in the office and not correlating at work. States that she does get very nervous coming to the doctor. Lab values were normal, not concerning for causes of hypertension. Encouraged her to buy a BP machine to keep a daily log and bring it to each visit.    Current Medication: Outpatient Encounter Medications as of 08/09/2019  Medication Sig  . amLODipine (NORVASC) 5 MG tablet Take 1 tablet (5 mg total) by mouth daily.  . sertraline (ZOLOFT) 25 MG tablet Take 1 tablet (25 mg total) by mouth daily.  . [DISCONTINUED] amLODipine (NORVASC) 5 MG tablet Take 1 tablet (5 mg total) by mouth daily.   No facility-administered encounter medications on file as of 08/09/2019.    Surgical History: Past Surgical History:  Procedure Laterality Date  . NO PAST SURGERIES      Medical History: Past Medical History:  Diagnosis Date  . Anxiety   . Chicken pox   . Hypertension     Family History: Family History  Problem Relation Age of Onset  . Pancreatic cancer Maternal Aunt   . Bladder Cancer Maternal Uncle     Social History   Socioeconomic History  . Marital status: Single    Spouse name: Not on file  . Number of children: Not on file  . Years of education: Not on file  . Highest education level: Not on file   Occupational History  . Not on file  Tobacco Use  . Smoking status: Former Smoker    Types: Cigarettes    Quit date: 05/02/2017    Years since quitting: 2.2  . Smokeless tobacco: Never Used  Substance and Sexual Activity  . Alcohol use: Yes  . Drug use: No  . Sexual activity: Not Currently  Other Topics Concern  . Not on file  Social History Narrative  . Not on file   Social Determinants of Health   Financial Resource Strain:   . Difficulty of Paying Living Expenses: Not on file  Food Insecurity:   . Worried About Programme researcher, broadcasting/film/video in the Last Year: Not on file  . Ran Out of Food in the Last Year: Not on file  Transportation Needs:   . Lack of Transportation (Medical): Not on file  . Lack of Transportation (Non-Medical): Not on file  Physical Activity:   . Days of Exercise per Week: Not on file  . Minutes of Exercise per Session: Not on file  Stress:   . Feeling of Stress : Not on file  Social Connections:   . Frequency of Communication with Friends and Family: Not on file  . Frequency of Social Gatherings with Friends and Family: Not on file  . Attends Religious Services: Not on file  . Active Member of Clubs or Organizations: Not  on file  . Attends Banker Meetings: Not on file  . Marital Status: Not on file  Intimate Partner Violence:   . Fear of Current or Ex-Partner: Not on file  . Emotionally Abused: Not on file  . Physically Abused: Not on file  . Sexually Abused: Not on file    Review of Systems  Constitutional: Negative for chills, fatigue and unexpected weight change.  HENT: Negative for congestion, rhinorrhea, sneezing and sore throat.   Eyes: Negative for photophobia, pain and redness.  Respiratory: Negative for cough, chest tightness and shortness of breath.   Cardiovascular: Negative for chest pain and palpitations.  Gastrointestinal: Negative for abdominal pain, constipation, diarrhea, nausea and vomiting.  Endocrine: Negative.    Genitourinary: Negative for dysuria and frequency.  Musculoskeletal: Negative for arthralgias, back pain, joint swelling and neck pain.  Skin: Negative for rash.  Allergic/Immunologic: Negative.   Neurological: Negative for tremors and numbness.  Hematological: Negative for adenopathy. Does not bruise/bleed easily.  Psychiatric/Behavioral: Negative for behavioral problems and sleep disturbance. The patient is not nervous/anxious.     Vital Signs: BP (!) 150/94   Pulse 96   Temp (!) 97.5 F (36.4 C)   Resp 16   Ht 5\' 8"  (1.727 m)   Wt 246 lb (111.6 kg)   LMP 07/26/2019   SpO2 100%   BMI 37.40 kg/m    Physical Exam Vitals and nursing note reviewed.  Constitutional:      General: She is not in acute distress.    Appearance: She is well-developed. She is not diaphoretic.  HENT:     Head: Normocephalic and atraumatic.     Mouth/Throat:     Pharynx: No oropharyngeal exudate.  Eyes:     Pupils: Pupils are equal, round, and reactive to light.  Neck:     Thyroid: No thyromegaly.     Vascular: No JVD.     Trachea: No tracheal deviation.  Cardiovascular:     Rate and Rhythm: Normal rate and regular rhythm.     Heart sounds: Normal heart sounds. No murmur. No friction rub. No gallop.   Pulmonary:     Effort: Pulmonary effort is normal. No respiratory distress.     Breath sounds: Normal breath sounds. No wheezing or rales.  Chest:     Chest wall: No tenderness.  Abdominal:     Palpations: Abdomen is soft.     Tenderness: There is no abdominal tenderness. There is no guarding.  Musculoskeletal:        General: Normal range of motion.     Cervical back: Normal range of motion and neck supple.  Lymphadenopathy:     Cervical: No cervical adenopathy.  Skin:    General: Skin is warm and dry.  Neurological:     Mental Status: She is alert and oriented to person, place, and time.     Cranial Nerves: No cranial nerve deficit.  Psychiatric:        Behavior: Behavior normal.         Thought Content: Thought content normal.        Judgment: Judgment normal.     Assessment/Plan: 1. Essential hypertension Continue current medication therapy. Encouraged to take BP daily at home or work and to bring to each visit. Will continue to closely monitor. Discussed to call office if BP's at home seem to be high or she develops symptoms such as a headache or blurred vision. - amLODipine (NORVASC) 5 MG tablet; Take 1 tablet (  5 mg total) by mouth daily.  Dispense: 30 tablet; Refill: 0  2. Neck pain Cervical neck radiographs were normal. Discussed she feels tension in her neck but feels as though this is related to stress. Discussed taking 30 minutes per day to focus on relieving her stress and meditating. Also discussed importance of diet and exercise.  3. Anxiety Stable on current therapy, continue to monitor.  General Counseling: [place] verbalizes understanding of the findings of todays visit and agrees with plan of treatment. I have discussed any further diagnostic evaluation that may be needed or ordered today. We also reviewed her medications today. she has been encouraged to call the office with any questions or concerns that should arise related to todays visit.    No orders of the defined types were placed in this encounter.   Meds ordered this encounter  Medications  . amLODipine (NORVASC) 5 MG tablet    Sig: Take 1 tablet (5 mg total) by mouth daily.    Dispense:  30 tablet    Refill:  0    Time spent: 25 Minutes   This patient was seen by Orson Gear AGNP-C in Collaboration with Dr Lavera Guise as a part of collaborative care agreement     Kendell Bane AGNP-C Internal medicine

## 2019-09-02 ENCOUNTER — Telehealth: Payer: Self-pay

## 2019-09-02 NOTE — Telephone Encounter (Signed)
Confirmed virtual visit with patient. klh 

## 2019-09-06 ENCOUNTER — Encounter: Payer: Self-pay | Admitting: Adult Health

## 2019-09-06 ENCOUNTER — Ambulatory Visit: Payer: BC Managed Care – PPO | Admitting: Adult Health

## 2019-09-06 VITALS — BP 134/84 | Ht 68.0 in | Wt 240.0 lb

## 2019-09-06 DIAGNOSIS — I10 Essential (primary) hypertension: Secondary | ICD-10-CM

## 2019-09-06 DIAGNOSIS — F411 Generalized anxiety disorder: Secondary | ICD-10-CM

## 2019-09-06 DIAGNOSIS — M542 Cervicalgia: Secondary | ICD-10-CM

## 2019-09-06 NOTE — Progress Notes (Signed)
[facility] [address] [place], [place] [address]  Internal MEDICINE  Telephone Visit  Patient Name: Heather Beck  [id number]  [id number]  Date of Service: 09/06/2019  I connected with the patient at 343 by telephone and verified the patients identity using two identifiers.   I discussed the limitations, risks, security and privacy concerns of performing an evaluation and management service by telephone and the availability of in person appointments. I also discussed with the patient that there may be a patient responsible charge related to the service.  The patient expressed understanding and agrees to proceed.    Chief Complaint  Patient presents with  . Telephone Assessment  . Telephone Screen  . Hypertension    HPI  Pt is seen via video.  She is following up on HTN.  She has been taking 5mg  of amlodipine with good results.  Denies Chest pain, Shortness of breath, palpitations, headache, or blurred vision. She reports taking her BP regularly, and is has been below 140/90 everyday.  She reports her Anxiety is well controlled on her zoloft.  She reports using meditation and other ways to help alleviate stress. Her neck pain is also improved from last visit.    Current Medication: Outpatient Encounter Medications as of 09/06/2019  Medication Sig  . amLODipine (NORVASC) 5 MG tablet Take 1 tablet (5 mg total) by mouth daily.  . sertraline (ZOLOFT) 25 MG tablet Take 1 tablet (25 mg total) by mouth daily.   No facility-administered encounter medications on file as of 09/06/2019.    Surgical History: Past Surgical History:  Procedure Laterality Date  . NO PAST SURGERIES      Medical History: Past Medical History:  Diagnosis Date  . Anxiety   . Chicken pox   . Hypertension     Family History: Family History  Problem Relation Age of Onset  . Pancreatic cancer Maternal Aunt   . Bladder Cancer Maternal Uncle     Social History   Socioeconomic History  .  Marital status: Single    Spouse name: Not on file  . Number of children: Not on file  . Years of education: Not on file  . Highest education level: Not on file  Occupational History  . Not on file  Tobacco Use  . Smoking status: Former Smoker    Types: Cigarettes    Quit date: 05/02/2017    Years since quitting: 2.3  . Smokeless tobacco: Never Used  Substance and Sexual Activity  . Alcohol use: Yes  . Drug use: No  . Sexual activity: Not Currently  Other Topics Concern  . Not on file  Social History Narrative  . Not on file   Social Determinants of Health   Financial Resource Strain:   . Difficulty of Paying Living Expenses: Not on file  Food Insecurity:   . Worried About 05/04/2017 in the Last Year: Not on file  . Ran Out of Food in the Last Year: Not on file  Transportation Needs:   . Lack of Transportation (Medical): Not on file  . Lack of Transportation (Non-Medical): Not on file  Physical Activity:   . Days of Exercise per Week: Not on file  . Minutes of Exercise per Session: Not on file  Stress:   . Feeling of Stress : Not on file  Social Connections:   . Frequency of Communication with Friends and Family: Not on file  . Frequency of Social Gatherings with Friends and Family: Not  on file  . Attends Religious Services: Not on file  . Active Member of Clubs or Organizations: Not on file  . Attends Archivist Meetings: Not on file  . Marital Status: Not on file  Intimate Partner Violence:   . Fear of Current or Ex-Partner: Not on file  . Emotionally Abused: Not on file  . Physically Abused: Not on file  . Sexually Abused: Not on file      Review of Systems  Constitutional: Negative for chills, fatigue and unexpected weight change.  HENT: Negative for congestion, rhinorrhea, sneezing and sore throat.   Eyes: Negative for photophobia, pain and redness.  Respiratory: Negative for cough, chest tightness and shortness of breath.    Cardiovascular: Negative for chest pain and palpitations.  Gastrointestinal: Negative for abdominal pain, constipation, diarrhea, nausea and vomiting.  Endocrine: Negative.   Genitourinary: Negative for dysuria and frequency.  Musculoskeletal: Negative for arthralgias, back pain, joint swelling and neck pain.  Skin: Negative for rash.  Allergic/Immunologic: Negative.   Neurological: Negative for tremors and numbness.  Hematological: Negative for adenopathy. Does not bruise/bleed easily.  Psychiatric/Behavioral: Negative for behavioral problems and sleep disturbance. The patient is not nervous/anxious.     Vital Signs: BP (!) 145/88   Ht 5\' 8"  (1.727 m)   Wt 240 lb (108.9 kg)   BMI 36.49 kg/m    Observation/Objective:  Well appearing, NAD noted.   Assessment/Plan: 1. Essential hypertension Stable, continue norvasc.  2. GAD (generalized anxiety disorder) Well controlled, continue current regimen  3. Neck pain Much improved, continue to follow.  General Counseling: [place] verbalizes understanding of the findings of today's phone visit and agrees with plan of treatment. I have discussed any further diagnostic evaluation that may be needed or ordered today. We also reviewed her medications today. she has been encouraged to call the office with any questions or concerns that should arise related to todays visit.    No orders of the defined types were placed in this encounter.   No orders of the defined types were placed in this encounter.   Time spent: [place] [facility] Internal medicine

## 2019-09-09 ENCOUNTER — Other Ambulatory Visit: Payer: Self-pay

## 2019-09-09 MED ORDER — AMLODIPINE BESYLATE 10 MG PO TABS: 10.0000 mg | ORAL_TABLET | Freq: Every day | ORAL | 5 refills | Status: DC

## 2019-09-09 NOTE — Telephone Encounter (Signed)
Pt called that her bp was high 138/92,129/94 and 144/93 as per adam d/c amlodipine 5 mg and send new pres for 10 mg and also advised that she can take 2 tab of 5 mg until she get 10 mg pres

## 2019-12-09 ENCOUNTER — Ambulatory Visit: Payer: BC Managed Care – PPO | Admitting: Adult Health

## 2019-12-10 ENCOUNTER — Telehealth: Payer: Self-pay

## 2019-12-10 NOTE — Telephone Encounter (Signed)
Called lmom informing patient of appointment on 12/14/2019. klh 

## 2019-12-14 ENCOUNTER — Other Ambulatory Visit: Payer: Self-pay

## 2019-12-14 ENCOUNTER — Encounter: Payer: Self-pay | Admitting: Adult Health

## 2019-12-14 ENCOUNTER — Ambulatory Visit: Payer: BC Managed Care – PPO | Admitting: Adult Health

## 2019-12-14 VITALS — BP 140/88 | HR 110 | Temp 97.5°F | Resp 16 | Ht 68.0 in | Wt 247.0 lb

## 2019-12-14 DIAGNOSIS — N6489 Other specified disorders of breast: Secondary | ICD-10-CM

## 2019-12-14 DIAGNOSIS — F411 Generalized anxiety disorder: Secondary | ICD-10-CM

## 2019-12-14 DIAGNOSIS — M545 Low back pain, unspecified: Secondary | ICD-10-CM

## 2019-12-14 DIAGNOSIS — G8929 Other chronic pain: Secondary | ICD-10-CM

## 2019-12-14 DIAGNOSIS — F419 Anxiety disorder, unspecified: Secondary | ICD-10-CM | POA: Diagnosis not present

## 2019-12-14 DIAGNOSIS — I10 Essential (primary) hypertension: Secondary | ICD-10-CM

## 2019-12-14 MED ORDER — SERTRALINE HCL 25 MG PO TABS: 25.0000 mg | ORAL_TABLET | Freq: Every day | ORAL | 1 refills | Status: DC

## 2019-12-14 NOTE — Progress Notes (Addendum)
[facility] [address] [place], [place] [address]  Internal MEDICINE  Office Visit Note  Patient Name: Heather Beck  [id number]  [id number]  Date of Service: 12/14/2019  Chief Complaint  Patient presents with  . Follow-up  . Hypertension    HPI  Pt is here for follow up on HTN.  She reports she has been doing well.  Denies any pain or needs at this time.  Denies Chest pain, Shortness of breath, palpitations, headache, or blurred vision. She is taking her amlodipine regularly.   She has chronic low back pain without sciatica, she believes this is from her pendulous breast.  She has bilateral shoulder pain as well where the straps of her bra cut down into her skin. She is considering breast reductions surgery as multiple medications have failed to help with her pain.  She has also had to be on light duty at work due to the pain in the past.   Current Medication: Outpatient Encounter Medications as of 12/14/2019  Medication Sig  . amLODipine (NORVASC) 10 MG tablet Take 1 tablet (10 mg total) by mouth daily.  . sertraline (ZOLOFT) 25 MG tablet Take 1 tablet (25 mg total) by mouth daily.  . [DISCONTINUED] sertraline (ZOLOFT) 25 MG tablet Take 1 tablet (25 mg total) by mouth daily.   No facility-administered encounter medications on file as of 12/14/2019.    Surgical History: Past Surgical History:  Procedure Laterality Date  . NO PAST SURGERIES      Medical History: Past Medical History:  Diagnosis Date  . Anxiety   . Chicken pox   . Hypertension     Family History: Family History  Problem Relation Age of Onset  . Pancreatic cancer Maternal Aunt   . Bladder Cancer Maternal Uncle     Social History   Socioeconomic History  . Marital status: Single    Spouse name: Not on file  . Number of children: Not on file  . Years of education: Not on file  . Highest education level: Not on file  Occupational History  . Not on file  Tobacco Use  . Smoking  status: Former Smoker    Types: Cigarettes    Quit date: 05/02/2017    Years since quitting: 2.6  . Smokeless tobacco: Never Used  Substance and Sexual Activity  . Alcohol use: Yes  . Drug use: No  . Sexual activity: Not Currently  Other Topics Concern  . Not on file  Social History Narrative  . Not on file   Social Determinants of Health   Financial Resource Strain:   . Difficulty of Paying Living Expenses:   Food Insecurity:   . Worried About Programme researcher, broadcasting/film/video in the Last Year:   . Barista in the Last Year:   Transportation Needs:   . Freight forwarder (Medical):   Marland Kitchen Lack of Transportation (Non-Medical):   Physical Activity:   . Days of Exercise per Week:   . Minutes of Exercise per Session:   Stress:   . Feeling of Stress :   Social Connections:   . Frequency of Communication with Friends and Family:   . Frequency of Social Gatherings with Friends and Family:   . Attends Religious Services:   . Active Member of Clubs or Organizations:   . Attends Banker Meetings:   Marland Kitchen Marital Status:   Intimate Partner Violence:   . Fear of Current or Ex-Partner:   . Emotionally Abused:   .  Physically Abused:   . Sexually Abused:       Review of Systems  Constitutional: Negative for chills, fatigue and unexpected weight change.  HENT: Negative for congestion, rhinorrhea, sneezing and sore throat.   Eyes: Negative for photophobia, pain and redness.  Respiratory: Negative for cough, chest tightness and shortness of breath.   Cardiovascular: Negative for chest pain and palpitations.  Gastrointestinal: Negative for abdominal pain, constipation, diarrhea, nausea and vomiting.  Endocrine: Negative.   Genitourinary: Negative for dysuria and frequency.  Musculoskeletal: Negative for arthralgias, back pain, joint swelling and neck pain.  Skin: Negative for rash.  Allergic/Immunologic: Negative.   Neurological: Negative for tremors and numbness.   Hematological: Negative for adenopathy. Does not bruise/bleed easily.  Psychiatric/Behavioral: Negative for behavioral problems and sleep disturbance. The patient is not nervous/anxious.     Vital Signs: BP 140/88   Pulse (!) 110   Temp (!) 97.5 F (36.4 C)   Resp 16   Ht 5\' 8"  (1.727 m)   Wt 247 lb (112 kg)   SpO2 100%   BMI 37.56 kg/m    Physical Exam Vitals and nursing note reviewed.  Constitutional:      General: She is not in acute distress.    Appearance: She is well-developed. She is not diaphoretic.  HENT:     Head: Normocephalic and atraumatic.     Mouth/Throat:     Pharynx: No oropharyngeal exudate.  Eyes:     Pupils: Pupils are equal, round, and reactive to light.  Neck:     Thyroid: No thyromegaly.     Vascular: No JVD.     Trachea: No tracheal deviation.  Cardiovascular:     Rate and Rhythm: Normal rate and regular rhythm.     Heart sounds: Normal heart sounds. No murmur. No friction rub. No gallop.   Pulmonary:     Effort: Pulmonary effort is normal. No respiratory distress.     Breath sounds: Normal breath sounds. No wheezing or rales.  Chest:     Chest wall: No tenderness.  Abdominal:     Palpations: Abdomen is soft.     Tenderness: There is no abdominal tenderness. There is no guarding.  Musculoskeletal:        General: Normal range of motion.     Cervical back: Normal range of motion and neck supple.  Lymphadenopathy:     Cervical: No cervical adenopathy.  Skin:    General: Skin is warm and dry.  Neurological:     Mental Status: She is alert and oriented to person, place, and time.     Cranial Nerves: No cranial nerve deficit.  Psychiatric:        Behavior: Behavior normal.        Thought Content: Thought content normal.        Judgment: Judgment normal.     Assessment/Plan: 1. Essential hypertension Continue amlodipine as prescribed.   2. GAD (generalized anxiety disorder) Stable, continue zoloft.   3. Morbid obesity  (HCC) Comorbidities HTn and anxiety.  Obesity Counseling: Risk Assessment: An assessment of behavioral risk factors was made today and includes lack of exercise sedentary lifestyle, lack of portion control and poor dietary habits.  Risk Modification Advice: She was counseled on portion control guidelines. Restricting daily caloric intake to 1800. The detrimental long term effects of obesity on her health and ongoing poor compliance was also discussed with the patient.  4. Chronic midline low back pain without sciatica Patient has had intermittent back issues,  due to large breasts.  She is considering breast reduction surgery.    6. Pendulous breast Pt has bra size 42 H, we discussed her low back pain, and the possibility of breast reduction surgery.   General Counseling: [place] verbalizes understanding of the findings of todays visit and agrees with plan of treatment. I have discussed any further diagnostic evaluation that may be needed or ordered today. We also reviewed her medications today. she has been encouraged to call the office with any questions or concerns that should arise related to todays visit.    No orders of the defined types were placed in this encounter.   Meds ordered this encounter  Medications  . sertraline (ZOLOFT) 25 MG tablet    Sig: Take 1 tablet (25 mg total) by mouth daily.    Dispense:  90 tablet    Refill:  1    Time spent: 30 Minutes   This patient was seen by Orson Gear AGNP-C in Collaboration with Dr Lavera Guise as a part of collaborative care agreement     Kendell Bane AGNP-C Internal medicine

## 2020-02-03 ENCOUNTER — Telehealth: Payer: Self-pay

## 2020-02-03 NOTE — Telephone Encounter (Signed)
Called lmom informing patient of appointment on 0706/2021. klh 

## 2020-02-04 ENCOUNTER — Telehealth: Payer: Self-pay

## 2020-02-04 NOTE — Telephone Encounter (Signed)
Patient rescheduled appointment on 02/08/2020 to 02/15/2020. klh

## 2020-02-08 ENCOUNTER — Ambulatory Visit: Payer: BC Managed Care – PPO | Admitting: Adult Health

## 2020-02-11 ENCOUNTER — Telehealth: Payer: Self-pay

## 2020-02-11 NOTE — Telephone Encounter (Signed)
Called lmom informing patient of appointment on 02/15/2020. klh °

## 2020-02-15 ENCOUNTER — Encounter: Payer: Self-pay | Admitting: Adult Health

## 2020-02-15 ENCOUNTER — Ambulatory Visit: Payer: BC Managed Care – PPO | Admitting: Adult Health

## 2020-02-15 ENCOUNTER — Other Ambulatory Visit: Payer: Self-pay

## 2020-02-15 VITALS — BP 143/96 | HR 101 | Temp 97.5°F | Resp 16 | Ht 68.0 in | Wt 245.8 lb

## 2020-02-15 DIAGNOSIS — L439 Lichen planus, unspecified: Secondary | ICD-10-CM

## 2020-02-15 DIAGNOSIS — I10 Essential (primary) hypertension: Secondary | ICD-10-CM

## 2020-02-15 DIAGNOSIS — F411 Generalized anxiety disorder: Secondary | ICD-10-CM | POA: Diagnosis not present

## 2020-02-15 MED ORDER — TRIAMCINOLONE ACETONIDE 0.1 % EX CREA: 1.0000 "application " | TOPICAL_CREAM | Freq: Two times a day (BID) | CUTANEOUS | 2 refills | Status: DC

## 2020-02-15 MED ORDER — DILTIAZEM HCL ER COATED BEADS 180 MG PO CP24: 180.0000 mg | ORAL_CAPSULE | Freq: Every day | ORAL | 2 refills | Status: DC

## 2020-02-15 MED ORDER — LOSARTAN POTASSIUM-HCTZ 50-12.5 MG PO TABS: 1.0000 | ORAL_TABLET | Freq: Every day | ORAL | 2 refills | Status: DC

## 2020-02-15 NOTE — Progress Notes (Signed)
[facility] [address] [place], [place] [address]  Internal MEDICINE  Office Visit Note  Patient Name: Heather Beck  [id number]  [id number]  Date of Service: 02/15/2020  Chief Complaint  Patient presents with  . Follow-up    8 week follow up HTN  . Hypertension  . Quality Metric Gaps    covid vaccine    HPI  PT is here for 8 week follow up on HTN.  She has been taking 10mg  of amlodipine daily.  She has noticed that she is having intermittent palpitations since starting the amlodipine.  It became more noticeable when the dose was increased to 10mg  daily.     Current Medication: Outpatient Encounter Medications as of 02/15/2020  Medication Sig  . sertraline (ZOLOFT) 25 MG tablet Take 1 tablet (25 mg total) by mouth daily.  . [DISCONTINUED] amLODipine (NORVASC) 10 MG tablet Take 1 tablet (10 mg total) by mouth daily.  diltiazem (CARDIZEM CD) 180 MG 24 hr capsule Take 1 capsule (180 mg total) by mouth at bedtime.  02/17/2020 losartan-hydrochlorothiazide (HYZAAR) 50-12.5 MG tablet Take 1 tablet by mouth daily.   No facility-administered encounter medications on file as of 02/15/2020.    Surgical History: Past Surgical History:  Procedure Laterality Date  . NO PAST SURGERIES      Medical History: Past Medical History:  Diagnosis Date  . Anxiety   . Chicken pox   . Hypertension     Family History: Family History  Problem Relation Age of Onset  . Pancreatic cancer Maternal Aunt   . Bladder Cancer Maternal Uncle     Social History   Socioeconomic History  . Marital status: Single    Spouse name: Not on file  . Number of children: Not on file  . Years of education: Not on file  . Highest education level: Not on file  Occupational History  . Not on file  Tobacco Use  . Smoking status: Former Smoker    Types: Cigarettes    Quit date: 05/02/2017    Years since quitting: 2.7  . Smokeless tobacco: Never Used  Vaping Use  . Vaping Use: Never used   Substance and Sexual Activity  . Alcohol use: Yes  . Drug use: No  . Sexual activity: Not Currently  Other Topics Concern  . Not on file  Social History Narrative  . Not on file   Social Determinants of Health   Financial Resource Strain:   . Difficulty of Paying Living Expenses:   Food Insecurity:   . Worried About 02/17/2020 in the Last Year:   . 05/04/2017 in the Last Year:   Transportation Needs:   . Programme researcher, broadcasting/film/video (Medical):   Barista Lack of Transportation (Non-Medical):   Physical Activity:   . Days of Exercise per Week:   . Minutes of Exercise per Session:   Stress:   . Feeling of Stress :   Social Connections:   . Frequency of Communication with Friends and Family:   . Frequency of Social Gatherings with Friends and Family:   . Attends Religious Services:   . Active Member of Clubs or Organizations:   . Attends Freight forwarder Meetings:   Marland Kitchen Marital Status:   Intimate Partner Violence:   . Fear of Current or Ex-Partner:   . Emotionally Abused:   Banker Physically Abused:   . Sexually Abused:       Review of Systems  Constitutional: Negative for  chills, fatigue and unexpected weight change.  HENT: Negative for congestion, rhinorrhea, sneezing and sore throat.   Eyes: Negative for photophobia, pain and redness.  Respiratory: Negative for cough, chest tightness and shortness of breath.   Cardiovascular: Positive for palpitations and leg swelling. Negative for chest pain.  Gastrointestinal: Negative for abdominal pain, constipation, diarrhea, nausea and vomiting.  Endocrine: Negative.   Genitourinary: Negative for dysuria and frequency.  Musculoskeletal: Negative for arthralgias, back pain, joint swelling and neck pain.  Skin: Negative for rash.  Allergic/Immunologic: Negative.   Neurological: Negative for tremors and numbness.  Hematological: Negative for adenopathy. Does not bruise/bleed easily.  Psychiatric/Behavioral: Negative for  behavioral problems and sleep disturbance. The patient is not nervous/anxious.     Vital Signs: BP (!) 143/96   Pulse (!) 101   Temp (!) 97.5 F (36.4 C)   Resp 16   Ht 5\' 8"  (1.727 m)   Wt 245 lb 12.8 oz (111.5 kg)   SpO2 100%   BMI 37.37 kg/m    Physical Exam Vitals and nursing note reviewed.  Constitutional:      General: She is not in acute distress.    Appearance: She is well-developed. She is not diaphoretic.  HENT:     Head: Normocephalic and atraumatic.     Mouth/Throat:     Pharynx: No oropharyngeal exudate.  Eyes:     Pupils: Pupils are equal, round, and reactive to light.  Neck:     Thyroid: No thyromegaly.     Vascular: No JVD.     Trachea: No tracheal deviation.  Cardiovascular:     Rate and Rhythm: Normal rate and regular rhythm.     Heart sounds: Normal heart sounds. No murmur heard.  No friction rub. No gallop.   Pulmonary:     Effort: Pulmonary effort is normal. No respiratory distress.     Breath sounds: Normal breath sounds. No wheezing or rales.  Chest:     Chest wall: No tenderness.  Abdominal:     Palpations: Abdomen is soft.     Tenderness: There is no abdominal tenderness. There is no guarding.  Musculoskeletal:        General: Normal range of motion.     Cervical back: Normal range of motion and neck supple.  Lymphadenopathy:     Cervical: No cervical adenopathy.  Skin:    General: Skin is warm and dry.  Neurological:     Mental Status: She is alert and oriented to person, place, and time.     Cranial Nerves: No cranial nerve deficit.  Psychiatric:        Behavior: Behavior normal.        Thought Content: Thought content normal.        Judgment: Judgment normal.    Assessment/Plan: 1. Essential hypertension Stop Norvasc and start Cardizem and hyzaar - losartan-hydrochlorothiazide (HYZAAR) 50-12.5 MG tablet; Take 1 tablet by mouth daily.  Dispense: 30 tablet; Refill: 2 - diltiazem (CARDIZEM CD) 180 MG 24 hr capsule; Take 1 capsule  (180 mg total) by mouth at bedtime.  Dispense: 30 capsule; Refill: 2 - Basic metabolic panel  2. GAD (generalized anxiety disorder) Overall doing well, continue zoloft and current   3. Lichen planus, unspecified Refilled Kenalog - triamcinolone cream (KENALOG) 0.1 %; Apply 1 application topically 2 (two) times daily.  Dispense: 80 g; Refill: 2  General Counseling: Shakia verbalizes understanding of the findings of todays visit and agrees with plan of treatment. I have discussed any  further diagnostic evaluation that may be needed or ordered today. We also reviewed her medications today. she has been encouraged to call the office with any questions or concerns that should arise related to todays visit.    No orders of the defined types were placed in this encounter.   Meds ordered this encounter  Medications  . losartan-hydrochlorothiazide (HYZAAR) 50-12.5 MG tablet    Sig: Take 1 tablet by mouth daily.    Dispense:  30 tablet    Refill:  2  . diltiazem (CARDIZEM CD) 180 MG 24 hr capsule    Sig: Take 1 capsule (180 mg total) by mouth at bedtime.    Dispense:  30 capsule    Refill:  2    Time spent: 30 Minutes   This patient was seen by Blima Ledger AGNP-C in Collaboration with Dr Lyndon Code as a part of collaborative care agreement     Johnna Acosta AGNP-C Internal medicine

## 2020-02-18 ENCOUNTER — Other Ambulatory Visit: Payer: Self-pay

## 2020-02-18 ENCOUNTER — Other Ambulatory Visit: Payer: Self-pay | Admitting: Adult Health

## 2020-02-18 DIAGNOSIS — F411 Generalized anxiety disorder: Secondary | ICD-10-CM

## 2020-02-18 MED ORDER — SERTRALINE HCL 25 MG PO TABS: 25.0000 mg | ORAL_TABLET | Freq: Every day | ORAL | 1 refills | Status: DC

## 2020-02-23 ENCOUNTER — Other Ambulatory Visit: Payer: Self-pay

## 2020-02-23 DIAGNOSIS — F411 Generalized anxiety disorder: Secondary | ICD-10-CM

## 2020-02-23 MED ORDER — SERTRALINE HCL 25 MG PO TABS: 25.0000 mg | ORAL_TABLET | Freq: Every day | ORAL | 1 refills | Status: DC

## 2020-03-13 DIAGNOSIS — I10 Essential (primary) hypertension: Secondary | ICD-10-CM | POA: Diagnosis not present

## 2020-03-14 ENCOUNTER — Telehealth: Payer: Self-pay

## 2020-03-14 LAB — BASIC METABOLIC PANEL
BUN/Creatinine Ratio: 16 (ref 9–23)
BUN: 11 mg/dL (ref 6–20)
CO2: 24 mmol/L (ref 20–29)
Calcium: 9 mg/dL (ref 8.7–10.2)
Chloride: 99 mmol/L (ref 96–106)
Creatinine, Ser: 0.68 mg/dL (ref 0.57–1.00)
GFR calc Af Amer: 132 mL/min/{1.73_m2} (ref >59)
GFR calc non Af Amer: 114 mL/min/{1.73_m2} (ref >59)
Glucose: 87 mg/dL (ref 65–99)
Potassium: 4.2 mmol/L (ref 3.5–5.2)
Sodium: 138 mmol/L (ref 134–144)

## 2020-03-14 NOTE — Telephone Encounter (Signed)
LMOM for office visit on 8/12 

## 2020-03-16 ENCOUNTER — Other Ambulatory Visit: Payer: Self-pay

## 2020-03-16 ENCOUNTER — Encounter: Payer: Self-pay | Admitting: Adult Health

## 2020-03-16 ENCOUNTER — Ambulatory Visit: Payer: BC Managed Care – PPO | Admitting: Adult Health

## 2020-03-16 VITALS — BP 138/72 | HR 96 | Temp 97.2°F | Resp 16 | Ht 68.0 in | Wt 250.0 lb

## 2020-03-16 DIAGNOSIS — M545 Low back pain, unspecified: Secondary | ICD-10-CM

## 2020-03-16 DIAGNOSIS — N6489 Other specified disorders of breast: Secondary | ICD-10-CM | POA: Diagnosis not present

## 2020-03-16 DIAGNOSIS — F411 Generalized anxiety disorder: Secondary | ICD-10-CM

## 2020-03-16 DIAGNOSIS — I10 Essential (primary) hypertension: Secondary | ICD-10-CM

## 2020-03-16 DIAGNOSIS — G8929 Other chronic pain: Secondary | ICD-10-CM

## 2020-03-16 NOTE — Progress Notes (Signed)
[facility] [address] [place], [place] [address]  Internal MEDICINE  Office Visit Note  Patient Name: Heather Beck  [id number]  [id number]  Date of Service: 03/16/2020  Chief Complaint  Patient presents with  . Follow-up    pt wants to discuss breast reduction  . Hypertension  . Quality Metric Gaps    HepC, HIV screening    HPI  Pt is here for follow up on HTN.  She reports her palpitations have resolved since stopping the Norvasc. She has been taking the Hyzaar and Losartan-hctz.  Her blood pressure is controlled today.  She reports feeling much better and denies any further issues with her bp.  Denies Chest pain, Shortness of breath, palpitations, headache, or blurred vision. She continues to have low back pain and bilateral shoulder pain that she believes is coming from her breasts.  She is interested in talking to someone about a reduction surgery. She currently is a 42 H Bra size.   Current Medication: Outpatient Encounter Medications as of 03/16/2020  Medication Sig  . diltiazem (CARDIZEM CD) 180 MG 24 hr capsule Take 1 capsule (180 mg total) by mouth at bedtime.  Marland Kitchen losartan-hydrochlorothiazide (HYZAAR) 50-12.5 MG tablet Take 1 tablet by mouth daily.  . sertraline (ZOLOFT) 25 MG tablet Take 1 tablet (25 mg total) by mouth daily.  Marland Kitchen triamcinolone cream (KENALOG) 0.1 % Apply 1 application topically 2 (two) times daily.   No facility-administered encounter medications on file as of 03/16/2020.    Surgical History: Past Surgical History:  Procedure Laterality Date  . NO PAST SURGERIES      Medical History: Past Medical History:  Diagnosis Date  . Anxiety   . Chicken pox   . Hypertension     Family History: Family History  Problem Relation Age of Onset  . Pancreatic cancer Maternal Aunt   . Bladder Cancer Maternal Uncle     Social History   Socioeconomic History  . Marital status: Single    Spouse name: Not on file  . Number of children:  Not on file  . Years of education: Not on file  . Highest education level: Not on file  Occupational History  . Not on file  Tobacco Use  . Smoking status: Former Smoker    Types: Cigarettes    Quit date: 05/02/2017    Years since quitting: 2.8  . Smokeless tobacco: Never Used  Vaping Use  . Vaping Use: Never used  Substance and Sexual Activity  . Alcohol use: Yes    Alcohol/week: 3.0 standard drinks    Types: 3 Glasses of wine per week  . Drug use: No  . Sexual activity: Not Currently  Other Topics Concern  . Not on file  Social History Narrative  . Not on file   Social Determinants of Health   Financial Resource Strain:   . Difficulty of Paying Living Expenses:   Food Insecurity:   . Worried About Programme researcher, broadcasting/film/video in the Last Year:   . Barista in the Last Year:   Transportation Needs:   . Freight forwarder (Medical):   Marland Kitchen Lack of Transportation (Non-Medical):   Physical Activity:   . Days of Exercise per Week:   . Minutes of Exercise per Session:   Stress:   . Feeling of Stress :   Social Connections:   . Frequency of Communication with Friends and Family:   . Frequency of Social Gatherings with Friends and Family:   .  Attends Religious Services:   . Active Member of Clubs or Organizations:   . Attends Banker Meetings:   Marland Kitchen Marital Status:   Intimate Partner Violence:   . Fear of Current or Ex-Partner:   . Emotionally Abused:   Marland Kitchen Physically Abused:   . Sexually Abused:       Review of Systems  Constitutional: Negative for chills, fatigue and unexpected weight change.  HENT: Negative for congestion, rhinorrhea, sneezing and sore throat.   Eyes: Negative for photophobia, pain and redness.  Respiratory: Negative for cough, chest tightness and shortness of breath.   Cardiovascular: Negative for chest pain and palpitations.  Gastrointestinal: Negative for abdominal pain, constipation, diarrhea, nausea and vomiting.  Endocrine:  Negative.   Genitourinary: Negative for dysuria and frequency.  Musculoskeletal: Positive for back pain. Negative for arthralgias, joint swelling and neck pain.  Skin: Negative for rash.  Allergic/Immunologic: Negative.   Neurological: Negative for tremors and numbness.  Hematological: Negative for adenopathy. Does not bruise/bleed easily.  Psychiatric/Behavioral: Negative for behavioral problems and sleep disturbance. The patient is not nervous/anxious.     Vital Signs: BP 138/72   Pulse 96   Temp (!) 97.2 F (36.2 C)   Resp 16   Ht 5\' 8"  (1.727 m)   Wt 250 lb (113.4 kg)   SpO2 99%   BMI 38.01 kg/m    Physical Exam Vitals and nursing note reviewed.  Constitutional:      General: She is not in acute distress.    Appearance: She is well-developed. She is not diaphoretic.  HENT:     Head: Normocephalic and atraumatic.     Mouth/Throat:     Pharynx: No oropharyngeal exudate.  Eyes:     Pupils: Pupils are equal, round, and reactive to light.  Neck:     Thyroid: No thyromegaly.     Vascular: No JVD.     Trachea: No tracheal deviation.  Cardiovascular:     Rate and Rhythm: Normal rate and regular rhythm.     Heart sounds: Normal heart sounds. No murmur heard.  No friction rub. No gallop.   Pulmonary:     Effort: Pulmonary effort is normal. No respiratory distress.     Breath sounds: Normal breath sounds. No wheezing or rales.  Chest:     Chest wall: No tenderness.  Abdominal:     Palpations: Abdomen is soft.     Tenderness: There is no abdominal tenderness. There is no guarding.  Musculoskeletal:        General: Normal range of motion.     Cervical back: Normal range of motion and neck supple.  Lymphadenopathy:     Cervical: No cervical adenopathy.  Skin:    General: Skin is warm and dry.  Neurological:     Mental Status: She is alert and oriented to person, place, and time.     Cranial Nerves: No cranial nerve deficit.  Psychiatric:        Behavior: Behavior  normal.        Thought Content: Thought content normal.        Judgment: Judgment normal.    Assessment/Plan: 1. Essential hypertension Continue Cardizem, and Hyzaar as directed.   2. GAD (generalized anxiety disorder) Continue zoloft as prescribed.   3. Pendulous breast Pt currently wearing 42H Bra, she has chronic pain likely due to the weight of her breast.   4. Chronic midline low back pain without sciatica She has been seeing her work ,  as well as our office.  She has been on light duty due to her back pain.  She has tried steroids, and NSAIDS over the years without much success.   General Counseling: [name] verbalizes understanding of the findings of todays visit and agrees with plan of treatment. I have discussed any further diagnostic evaluation that may be needed or ordered today. We also reviewed her medications today. she has been encouraged to call the office with any questions or concerns that should arise related to todays visit.    Orders Placed This Encounter  Procedures  . Ambulatory referral to Plastic Surgery    No orders of the defined types were placed in this encounter.   Time spent: 30 Minutes   This patient was seen by Blima Ledger AGNP-C in Collaboration with Dr Lyndon Code as a part of collaborative care agreement     Johnna Acosta AGNP-C Internal medicine

## 2020-04-17 ENCOUNTER — Other Ambulatory Visit: Payer: Self-pay | Admitting: Adult Health

## 2020-04-17 DIAGNOSIS — I10 Essential (primary) hypertension: Secondary | ICD-10-CM

## 2020-04-20 ENCOUNTER — Telehealth: Payer: Self-pay

## 2020-04-20 NOTE — Telephone Encounter (Signed)
LMOM for OV on 9/20 

## 2020-04-24 ENCOUNTER — Encounter: Payer: Self-pay | Admitting: Internal Medicine

## 2020-04-24 ENCOUNTER — Other Ambulatory Visit: Payer: Self-pay

## 2020-04-24 ENCOUNTER — Ambulatory Visit: Payer: BC Managed Care – PPO | Admitting: Internal Medicine

## 2020-04-24 VITALS — BP 136/80 | HR 91 | Temp 97.5°F | Resp 16 | Ht 67.5 in | Wt 250.8 lb

## 2020-04-24 DIAGNOSIS — N6489 Other specified disorders of breast: Secondary | ICD-10-CM | POA: Diagnosis not present

## 2020-04-24 DIAGNOSIS — M542 Cervicalgia: Secondary | ICD-10-CM | POA: Diagnosis not present

## 2020-04-24 DIAGNOSIS — I10 Essential (primary) hypertension: Secondary | ICD-10-CM | POA: Diagnosis not present

## 2020-04-24 NOTE — Progress Notes (Signed)
[facility] [address] [place], [place] [address]  Internal MEDICINE  Office Visit Note  Patient Name: Heather Beck  [id number]  [id number]  Date of Service: 04/27/2020  Chief Complaint  Patient presents with  . Follow-up    started last week muscle spasams right arm near elbow, legs a few times a day, arm spasams stopped about 3 days ago  . Hypertension  . Quality Metric Gaps    HepC    HPI Pt is here for routine follow up 1. Upper back, neck and arm pain, chronic in nature, gets worse as the day go by, she does have very large breast which put a strain on her upper back and arms. She is looking into getting breast reduction surgery   2. HTN, improved numbers, takes 2 different antihypertensives and feeling fairly well   Current Medication: Outpatient Encounter Medications as of 04/24/2020  Medication Sig  . diltiazem (CARDIZEM CD) 180 MG 24 hr capsule TAKE 1 CAPSULE (180 MG TOTAL) BY MOUTH AT BEDTIME.  Marland Kitchen losartan-hydrochlorothiazide (HYZAAR) 50-12.5 MG tablet Take 1 tablet by mouth daily.  . sertraline (ZOLOFT) 25 MG tablet Take 1 tablet (25 mg total) by mouth daily.  Marland Kitchen triamcinolone cream (KENALOG) 0.1 % Apply 1 application topically 2 (two) times daily.   No facility-administered encounter medications on file as of 04/24/2020.    Surgical History: Past Surgical History:  Procedure Laterality Date  . NO PAST SURGERIES      Medical History: Past Medical History:  Diagnosis Date  . Anxiety   . Chicken pox   . Hypertension     Family History: Family History  Problem Relation Age of Onset  . Pancreatic cancer Maternal Aunt   . Bladder Cancer Maternal Uncle     Social History   Socioeconomic History  . Marital status: Single    Spouse name: Not on file  . Number of children: Not on file  . Years of education: Not on file  . Highest education level: Not on file  Occupational History  . Not on file  Tobacco Use  . Smoking status: Former  Smoker    Types: Cigarettes    Quit date: 05/02/2017    Years since quitting: 2.9  . Smokeless tobacco: Never Used  Vaping Use  . Vaping Use: Never used  Substance and Sexual Activity  . Alcohol use: Yes    Alcohol/week: 3.0 standard drinks    Types: 3 Glasses of wine per week  . Drug use: No  . Sexual activity: Not Currently  Other Topics Concern  . Not on file  Social History Narrative  . Not on file   Social Determinants of Health   Financial Resource Strain:   . Difficulty of Paying Living Expenses: Not on file  Food Insecurity:   . Worried About Programme researcher, broadcasting/film/video in the Last Year: Not on file  . Ran Out of Food in the Last Year: Not on file  Transportation Needs:   . Lack of Transportation (Medical): Not on file  . Lack of Transportation (Non-Medical): Not on file  Physical Activity:   . Days of Exercise per Week: Not on file  . Minutes of Exercise per Session: Not on file  Stress:   . Feeling of Stress : Not on file  Social Connections:   . Frequency of Communication with Friends and Family: Not on file  . Frequency of Social Gatherings with Friends and Family: Not on file  . Attends Religious  Services: Not on file  . Active Member of Clubs or Organizations: Not on file  . Attends Banker Meetings: Not on file  . Marital Status: Not on file  Intimate Partner Violence:   . Fear of Current or Ex-Partner: Not on file  . Emotionally Abused: Not on file  . Physically Abused: Not on file  . Sexually Abused: Not on file      Review of Systems  Constitutional: Negative for chills, diaphoresis and fatigue.  HENT: Negative for ear pain, postnasal drip and sinus pressure.   Eyes: Negative for photophobia, discharge, redness, itching and visual disturbance.  Respiratory: Negative for cough, shortness of breath and wheezing.   Cardiovascular: Negative for chest pain, palpitations and leg swelling.  Gastrointestinal: Negative for abdominal pain,  constipation, diarrhea, nausea and vomiting.  Genitourinary: Negative for dysuria and flank pain.  Musculoskeletal: Positive for arthralgias, neck pain and neck stiffness. Negative for gait problem.  Skin: Negative for color change.  Allergic/Immunologic: Negative for environmental allergies and food allergies.  Neurological: Negative for dizziness and headaches.  Hematological: Does not bruise/bleed easily.  Psychiatric/Behavioral: Negative for agitation, behavioral problems (depression) and hallucinations.    Vital Signs: BP 136/80   Pulse 91   Temp (!) 97.5 F (36.4 C)   Resp 16   Ht 5' 7.5" (1.715 m)   Wt 250 lb 12.8 oz (113.8 kg)   SpO2 100%   BMI 38.70 kg/m    Physical Exam Constitutional:      Appearance: Normal appearance.  HENT:     Head: Normocephalic.  Eyes:     Extraocular Movements: Extraocular movements intact.     Pupils: Pupils are equal, round, and reactive to light.  Cardiovascular:     Rate and Rhythm: Normal rate and regular rhythm.     Pulses: Normal pulses.  Pulmonary:     Effort: Pulmonary effort is normal.  Chest:     Breasts:        Right: Normal.        Left: Normal.     Comments: Large breast   Musculoskeletal:        General: Tenderness present.     Comments: c spine   Neurological:     Mental Status: She is alert.     Assessment/Plan: 1. Essential hypertension BP is well controlled, continue double therapy. Monitor renal functions   2. Pendulous breast Large and heavy breast, will get benefit from breast reduction   3. Neck pain Most likely due to her breast. Will get benefit from breast reduction  General Counseling: Heather Beck verbalizes understanding of the findings of todays visit and agrees with plan of treatment. I have discussed any further diagnostic evaluation that may be needed or ordered today. We also reviewed her medications today. she has been encouraged to call the office with any questions or concerns that should  arise related to todays visit.  Total time spent: Time spent includes review of chart, medications, test results, and follow up plan with the patient.   Dr Lyndon Code Internal medicine

## 2020-05-22 ENCOUNTER — Other Ambulatory Visit: Payer: Self-pay

## 2020-05-22 ENCOUNTER — Ambulatory Visit: Payer: BC Managed Care – PPO | Admitting: Adult Health

## 2020-05-22 ENCOUNTER — Encounter: Payer: Self-pay | Admitting: Adult Health

## 2020-05-22 VITALS — BP 140/82 | HR 95 | Temp 97.2°F | Resp 16 | Ht 67.5 in | Wt 250.4 lb

## 2020-05-22 DIAGNOSIS — N6489 Other specified disorders of breast: Secondary | ICD-10-CM

## 2020-05-22 DIAGNOSIS — M545 Low back pain, unspecified: Secondary | ICD-10-CM | POA: Diagnosis not present

## 2020-05-22 DIAGNOSIS — F411 Generalized anxiety disorder: Secondary | ICD-10-CM | POA: Diagnosis not present

## 2020-05-22 DIAGNOSIS — G8929 Other chronic pain: Secondary | ICD-10-CM

## 2020-05-22 DIAGNOSIS — I10 Essential (primary) hypertension: Secondary | ICD-10-CM

## 2020-05-22 MED ORDER — LOSARTAN POTASSIUM-HCTZ 50-12.5 MG PO TABS: 1.0000 | ORAL_TABLET | Freq: Every day | ORAL | 1 refills | Status: DC

## 2020-05-22 MED ORDER — LOSARTAN POTASSIUM-HCTZ 50-12.5 MG PO TABS: 1.0000 | ORAL_TABLET | Freq: Every day | ORAL | 2 refills | Status: DC

## 2020-05-22 NOTE — Progress Notes (Signed)
[facility] [address] [place], [place] [address]  Internal MEDICINE  Office Visit Note  Patient Name: Heather Beck  [id number]  [id number]  Date of Service: 05/22/2020  Chief Complaint  Patient presents with  . Follow-up  . Anxiety  . Hypertension  . policy update form    received    HPI  Pt is here for follow up on anxiety and HTN.  She reports she has been doing well.  Her blood pressure has been controlled. Denies Chest pain, Shortness of breath, palpitations, headache, or blurred vision.  She did drink a coffee before coming to the office today. She reports her anxiety is better controlled on zoloft however she has had a panic attack recently which causes her to miss work a few weeks ago.  She is not intersted in increasing her zoloft at this point, but she is worried that she may miss work again.  She would like to discuss intermittent FMLA, so that she does not have to be afraid she will lose her job if she is out.   She denies any new or concerning symptoms.      Current Medication: Outpatient Encounter Medications as of 05/22/2020  Medication Sig  . diltiazem (CARDIZEM CD) 180 MG 24 hr capsule TAKE 1 CAPSULE (180 MG TOTAL) BY MOUTH AT BEDTIME.  Marland Kitchen sertraline (ZOLOFT) 25 MG tablet Take 1 tablet (25 mg total) by mouth daily.  Marland Kitchen triamcinolone cream (KENALOG) 0.1 % Apply 1 application topically 2 (two) times daily.  . [DISCONTINUED] losartan-hydrochlorothiazide (HYZAAR) 50-12.5 MG tablet Take 1 tablet by mouth daily.  . [DISCONTINUED] losartan-hydrochlorothiazide (HYZAAR) 50-12.5 MG tablet Take 1 tablet by mouth daily.  Marland Kitchen losartan-hydrochlorothiazide (HYZAAR) 50-12.5 MG tablet Take 1 tablet by mouth daily.   No facility-administered encounter medications on file as of 05/22/2020.    Surgical History: Past Surgical History:  Procedure Laterality Date  . NO PAST SURGERIES      Medical History: Past Medical History:  Diagnosis Date  . Anxiety   .  Chicken pox   . Hypertension     Family History: Family History  Problem Relation Age of Onset  . Pancreatic cancer Maternal Aunt   . Bladder Cancer Maternal Uncle     Social History   Socioeconomic History  . Marital status: Single    Spouse name: Not on file  . Number of children: Not on file  . Years of education: Not on file  . Highest education level: Not on file  Occupational History  . Not on file  Tobacco Use  . Smoking status: Former Smoker    Types: Cigarettes    Quit date: 05/02/2017    Years since quitting: 3.0  . Smokeless tobacco: Never Used  Vaping Use  . Vaping Use: Never used  Substance and Sexual Activity  . Alcohol use: Yes    Alcohol/week: 3.0 standard drinks    Types: 3 Glasses of wine per week  . Drug use: No  . Sexual activity: Not Currently  Other Topics Concern  . Not on file  Social History Narrative  . Not on file   Social Determinants of Health   Financial Resource Strain:   . Difficulty of Paying Living Expenses: Not on file  Food Insecurity:   . Worried About Programme researcher, broadcasting/film/video in the Last Year: Not on file  . Ran Out of Food in the Last Year: Not on file  Transportation Needs:   . Lack of Transportation (Medical): Not  on file  . Lack of Transportation (Non-Medical): Not on file  Physical Activity:   . Days of Exercise per Week: Not on file  . Minutes of Exercise per Session: Not on file  Stress:   . Feeling of Stress : Not on file  Social Connections:   . Frequency of Communication with Friends and Family: Not on file  . Frequency of Social Gatherings with Friends and Family: Not on file  . Attends Religious Services: Not on file  . Active Member of Clubs or Organizations: Not on file  . Attends Banker Meetings: Not on file  . Marital Status: Not on file  Intimate Partner Violence:   . Fear of Current or Ex-Partner: Not on file  . Emotionally Abused: Not on file  . Physically Abused: Not on file  . Sexually  Abused: Not on file      Review of Systems  Constitutional: Negative for chills, fatigue and unexpected weight change.  HENT: Negative for congestion, rhinorrhea, sneezing and sore throat.   Eyes: Negative for photophobia, pain and redness.  Respiratory: Negative for cough, chest tightness and shortness of breath.   Cardiovascular: Negative for chest pain and palpitations.  Gastrointestinal: Negative for abdominal pain, constipation, diarrhea, nausea and vomiting.  Endocrine: Negative.   Genitourinary: Negative for dysuria and frequency.  Musculoskeletal: Negative for arthralgias, back pain, joint swelling and neck pain.  Skin: Negative for rash.  Allergic/Immunologic: Negative.   Neurological: Negative for tremors and numbness.  Hematological: Negative for adenopathy. Does not bruise/bleed easily.  Psychiatric/Behavioral: Negative for behavioral problems and sleep disturbance. The patient is not nervous/anxious.     Vital Signs: BP (!) 150/100   Pulse 95   Temp (!) 97.2 F (36.2 C)   Resp 16   Ht 5' 7.5" (1.715 m)   Wt 250 lb 6.4 oz (113.6 kg)   SpO2 98%   BMI 38.64 kg/m    Physical Exam Vitals and nursing note reviewed.  Constitutional:      General: She is not in acute distress.    Appearance: She is well-developed. She is not diaphoretic.  HENT:     Head: Normocephalic and atraumatic.     Mouth/Throat:     Pharynx: No oropharyngeal exudate.  Eyes:     Pupils: Pupils are equal, round, and reactive to light.  Neck:     Thyroid: No thyromegaly.     Vascular: No JVD.     Trachea: No tracheal deviation.  Cardiovascular:     Rate and Rhythm: Normal rate and regular rhythm.     Heart sounds: Normal heart sounds. No murmur heard.  No friction rub. No gallop.   Pulmonary:     Effort: Pulmonary effort is normal. No respiratory distress.     Breath sounds: Normal breath sounds. No wheezing or rales.  Chest:     Chest wall: No tenderness.  Abdominal:      Palpations: Abdomen is soft.     Tenderness: There is no abdominal tenderness. There is no guarding.  Musculoskeletal:        General: Normal range of motion.     Cervical back: Normal range of motion and neck supple.  Lymphadenopathy:     Cervical: No cervical adenopathy.  Skin:    General: Skin is warm and dry.  Neurological:     Mental Status: She is alert and oriented to person, place, and time.     Cranial Nerves: No cranial nerve deficit.  Psychiatric:  Behavior: Behavior normal.        Thought Content: Thought content normal.        Judgment: Judgment normal.    Assessment/Plan: 1. Essential hypertension Stable, continue with cardizem and losartan as prescribed.  - losartan-hydrochlorothiazide (HYZAAR) 50-12.5 MG tablet; Take 1 tablet by mouth daily.  Dispense: 90 tablet; Refill: 1  2. GAD (generalized anxiety disorder) Stable, continue zoloft as prescribed.   3. Chronic midline low back pain without sciatica Likely due to pendulous breast, continue with referral to surgeon.   4. Pendulous breast Continue to follow up with surgeon as discussed.   General Counseling: Heather Beck verbalizes understanding of the findings of todays visit and agrees with plan of treatment. I have discussed any further diagnostic evaluation that may be needed or ordered today. We also reviewed her medications today. she has been encouraged to call the office with any questions or concerns that should arise related to todays visit.    No orders of the defined types were placed in this encounter.   Meds ordered this encounter  Medications  . DISCONTD: losartan-hydrochlorothiazide (HYZAAR) 50-12.5 MG tablet    Sig: Take 1 tablet by mouth daily.    Dispense:  30 tablet    Refill:  2  . losartan-hydrochlorothiazide (HYZAAR) 50-12.5 MG tablet    Sig: Take 1 tablet by mouth daily.    Dispense:  90 tablet    Refill:  1    Time spent: 30 Minutes   This patient was seen by Blima Ledger  AGNP-C in Collaboration with Dr Lyndon Code as a part of collaborative care agreement     Johnna Acosta AGNP-C Internal medicine

## 2020-06-01 ENCOUNTER — Telehealth: Payer: Self-pay

## 2020-06-01 NOTE — Telephone Encounter (Signed)
Left message and advised pt her paperwork is up front ready for pick up. Heather Beck

## 2020-06-19 ENCOUNTER — Encounter: Payer: Self-pay | Admitting: Internal Medicine

## 2020-06-19 ENCOUNTER — Ambulatory Visit (INDEPENDENT_AMBULATORY_CARE_PROVIDER_SITE_OTHER): Payer: BC Managed Care – PPO | Admitting: Internal Medicine

## 2020-06-19 ENCOUNTER — Other Ambulatory Visit: Payer: Self-pay

## 2020-06-19 VITALS — BP 142/100 | HR 92 | Temp 98.3°F | Resp 16 | Ht 67.5 in | Wt 256.2 lb

## 2020-06-19 DIAGNOSIS — N6489 Other specified disorders of breast: Secondary | ICD-10-CM | POA: Diagnosis not present

## 2020-06-19 DIAGNOSIS — I10 Essential (primary) hypertension: Secondary | ICD-10-CM | POA: Diagnosis not present

## 2020-06-19 DIAGNOSIS — Z0001 Encounter for general adult medical examination with abnormal findings: Secondary | ICD-10-CM

## 2020-06-19 DIAGNOSIS — F411 Generalized anxiety disorder: Secondary | ICD-10-CM

## 2020-06-19 DIAGNOSIS — R300 Dysuria: Secondary | ICD-10-CM | POA: Diagnosis not present

## 2020-06-19 MED ORDER — SERTRALINE HCL 50 MG PO TABS: ORAL_TABLET | ORAL | 1 refills | Status: DC

## 2020-06-19 NOTE — Progress Notes (Signed)
[facility] [address] [place], [place] [address]  Internal MEDICINE  Office Visit Note  Patient Name: Heather Beck  [id number]  [id number]  Date of Service: 06/19/2020  Chief Complaint  Patient presents with  . Annual Exam  . controlled substance policy    acknowledged  . Quality Metric Gaps    HIV screen, Hep C screen     HPI Pt is here for routine health maintenance examination Feels well. Has been more anxious and is willing to increase Zoloft, bp is slightly elevated as well Continues to have neck and arm pain  Due to large breast. Feels a lot of pressure  Takes all medications as prescribed  Pap is usually done by obgyn  Current Medication: Outpatient Encounter Medications as of 06/19/2020  Medication Sig  . diltiazem (CARDIZEM CD) 180 MG 24 hr capsule TAKE 1 CAPSULE (180 MG TOTAL) BY MOUTH AT BEDTIME.  Marland Kitchen losartan-hydrochlorothiazide (HYZAAR) 50-12.5 MG tablet Take 1 tablet by mouth daily.  . sertraline (ZOLOFT) 50 MG tablet Take one tab po in pm for GAD  . triamcinolone cream (KENALOG) 0.1 % Apply 1 application topically 2 (two) times daily.  . [DISCONTINUED] sertraline (ZOLOFT) 25 MG tablet Take 1 tablet (25 mg total) by mouth daily.  . [DISCONTINUED] amLODipine (NORVASC) 5 MG tablet Take 1 tablet (5 mg total) by mouth daily.  . [DISCONTINUED] sertraline (ZOLOFT) 25 MG tablet Take 1 tablet (25 mg total) by mouth daily.   No facility-administered encounter medications on file as of 06/19/2020.    Surgical History: Past Surgical History:  Procedure Laterality Date  . NO PAST SURGERIES      Medical History: Past Medical History:  Diagnosis Date  . Anxiety   . Chicken pox   . Hypertension     Family History: Family History  Problem Relation Age of Onset  . Pancreatic cancer Maternal Aunt   . Bladder Cancer Maternal Uncle       Review of Systems  Constitutional: Negative for chills, diaphoresis and fatigue.  HENT: Negative for  ear pain, postnasal drip and sinus pressure.   Eyes: Negative for photophobia, discharge, redness, itching and visual disturbance.  Respiratory: Negative for cough, shortness of breath and wheezing.   Cardiovascular: Negative for chest pain, palpitations and leg swelling.  Gastrointestinal: Negative for abdominal pain, constipation, diarrhea, nausea and vomiting.  Genitourinary: Negative for dysuria and flank pain.  Musculoskeletal: Positive for arthralgias and back pain. Negative for gait problem and neck pain.  Skin: Negative for color change.  Allergic/Immunologic: Negative for environmental allergies and food allergies.  Neurological: Negative for dizziness and headaches.  Hematological: Does not bruise/bleed easily.  Psychiatric/Behavioral: Negative for agitation, behavioral problems (depression) and hallucinations.     Vital Signs: BP (!) 142/100   Pulse 92   Temp 98.3 F (36.8 C)   Resp 16   Ht 5' 7.5" (1.715 m)   Wt 256 lb 3.2 oz (116.2 kg)   SpO2 99%   BMI 39.53 kg/m    Physical Exam Constitutional:      General: She is not in acute distress.    Appearance: She is well-developed. She is not diaphoretic.  HENT:     Head: Normocephalic and atraumatic.     Mouth/Throat:     Pharynx: No oropharyngeal exudate.  Eyes:     Pupils: Pupils are equal, round, and reactive to light.  Neck:     Thyroid: No thyromegaly.     Vascular: No JVD.  Trachea: No tracheal deviation.  Cardiovascular:     Rate and Rhythm: Normal rate and regular rhythm.     Heart sounds: Normal heart sounds. No murmur heard.  No friction rub. No gallop.   Pulmonary:     Effort: Pulmonary effort is normal. No respiratory distress.     Breath sounds: No wheezing or rales.  Chest:     Chest wall: No tenderness.     Breasts:        Right: Normal. No mass.        Left: Normal. No mass.     Comments: Hard to palpate due to large size of breast, feel dense as well  Abdominal:     General: Bowel  sounds are normal.     Palpations: Abdomen is soft.  Musculoskeletal:        General: Normal range of motion.     Cervical back: Normal range of motion and neck supple.  Lymphadenopathy:     Cervical: No cervical adenopathy.  Skin:    General: Skin is warm and dry.  Neurological:     Mental Status: She is alert and oriented to person, place, and time.     Cranial Nerves: No cranial nerve deficit.  Psychiatric:        Behavior: Behavior normal.        Thought Content: Thought content normal.        Judgment: Judgment normal.    Assessment/Plan: 1. Encounter for general adult medical examination with abnormal findings Age appropriate PHM is updated, might need mammogram due to size and density of breast   2. Essential hypertension Slightly elevated BP today but is more anxious, will monitor at home   3. GAD (generalized anxiety disorder) Will increase Zoloft to 50 mg qd to se eif can get better control  - sertraline (ZOLOFT) 50 MG tablet; Take one tab po in pm for GAD  Dispense: 90 tablet; Refill: 1  4. Pendulous breast Will be getting breast reducion surgery due to ongoing neck and upper back pain and effects her ADL's and work  5. Dysuria - UA/M w/rflx Culture, Routine  General Counseling: [name] verbalizes understanding of the findings of todays visit and agrees with plan of treatment. I have discussed any further diagnostic evaluation that may be needed or ordered today. We also reviewed her medications today. she has been encouraged to call the office with any questions or concerns that should arise related to todays visit.    Orders Placed This Encounter  Procedures  . UA/M w/rflx Culture, Routine    Meds ordered this encounter  Medications  . sertraline (ZOLOFT) 50 MG tablet    Sig: Take one tab po in pm for GAD    Dispense:  90 tablet    Refill:  1    Total time spent:30 Minutes  Time spent includes review of chart, medications, test results, and follow up  plan with the patient.     Lyndon Code, MD  Internal Medicine

## 2020-06-20 LAB — UA/M W/RFLX CULTURE, ROUTINE
Bilirubin, UA: NEGATIVE
Glucose, UA: NEGATIVE
Ketones, UA: NEGATIVE
Leukocytes,UA: NEGATIVE
Nitrite, UA: NEGATIVE
Protein,UA: NEGATIVE
RBC, UA: NEGATIVE
Specific Gravity, UA: 1.018 (ref 1.005–1.030)
Urobilinogen, Ur: 0.2 mg/dL (ref 0.2–1.0)
pH, UA: 8 — ABNORMAL HIGH (ref 5.0–7.5)

## 2020-06-20 LAB — MICROSCOPIC EXAMINATION
Casts: NONE SEEN /lpf
RBC, Urine: NONE SEEN /hpf (ref 0–2)
WBC, UA: NONE SEEN /hpf (ref 0–5)

## 2020-06-20 LAB — SPECIMEN STATUS REPORT

## 2020-08-03 ENCOUNTER — Other Ambulatory Visit: Payer: Self-pay | Admitting: Internal Medicine

## 2020-08-03 DIAGNOSIS — I10 Essential (primary) hypertension: Secondary | ICD-10-CM

## 2020-08-22 ENCOUNTER — Encounter: Payer: Self-pay | Admitting: Hospice and Palliative Medicine

## 2020-08-22 ENCOUNTER — Ambulatory Visit: Payer: BC Managed Care – PPO | Admitting: Hospice and Palliative Medicine

## 2020-09-21 ENCOUNTER — Other Ambulatory Visit: Payer: Self-pay

## 2020-09-21 ENCOUNTER — Ambulatory Visit: Payer: BC Managed Care – PPO | Admitting: Hospice and Palliative Medicine

## 2020-09-21 ENCOUNTER — Encounter: Payer: Self-pay | Admitting: Hospice and Palliative Medicine

## 2020-09-21 VITALS — BP 136/88 | HR 93 | Temp 97.4°F | Resp 16 | Ht 67.5 in | Wt 249.0 lb

## 2020-09-21 DIAGNOSIS — E6609 Other obesity due to excess calories: Secondary | ICD-10-CM | POA: Diagnosis not present

## 2020-09-21 DIAGNOSIS — Z6838 Body mass index (BMI) 38.0-38.9, adult: Secondary | ICD-10-CM

## 2020-09-21 DIAGNOSIS — F411 Generalized anxiety disorder: Secondary | ICD-10-CM

## 2020-09-21 DIAGNOSIS — I10 Essential (primary) hypertension: Secondary | ICD-10-CM | POA: Diagnosis not present

## 2020-09-21 DIAGNOSIS — R5383 Other fatigue: Secondary | ICD-10-CM | POA: Diagnosis not present

## 2020-09-21 NOTE — Progress Notes (Signed)
[facility] [address] [place], [place] [address]  Internal MEDICINE  Office Visit Note  Patient Name: Heather Beck  [id number]  [id number]  Date of Service: 09/24/2020  Chief Complaint  Patient presents with  . Follow-up    Paperwork  . Hypertension  . Anxiety    HPI Patient is here for routine follow-up At last visit, Zoloft increased due to uncontrolled anxiety--feels that increased dose has better controlled her symptoms Brought FMLA paperwork with her today that needs to be reviewed, currently allowed 3 days per month for anxiety and depression BP readings from home have been better controlled--initially elevated today Still looking into getting breast reduction surgery Needs updated routine labs  Current Medication: Outpatient Encounter Medications as of 09/21/2020  Medication Sig  . diltiazem (CARDIZEM CD) 180 MG 24 hr capsule TAKE 1 CAPSULE (180 MG TOTAL) BY MOUTH AT BEDTIME.  Marland Kitchen losartan-hydrochlorothiazide (HYZAAR) 50-12.5 MG tablet Take 1 tablet by mouth daily.  . sertraline (ZOLOFT) 50 MG tablet Take one tab po in pm for GAD  . triamcinolone cream (KENALOG) 0.1 % Apply 1 application topically 2 (two) times daily.   No facility-administered encounter medications on file as of 09/21/2020.    Surgical History: Past Surgical History:  Procedure Laterality Date  . NO PAST SURGERIES      Medical History: Past Medical History:  Diagnosis Date  . Anxiety   . Chicken pox   . Hypertension     Family History: Family History  Problem Relation Age of Onset  . Pancreatic cancer Maternal Aunt   . Bladder Cancer Maternal Uncle     Social History   Socioeconomic History  . Marital status: Single    Spouse name: Not on file  . Number of children: Not on file  . Years of education: Not on file  . Highest education level: Not on file  Occupational History  . Not on file  Tobacco Use  . Smoking status: Former Smoker    Types: Cigarettes     Quit date: 05/02/2017    Years since quitting: 3.4  . Smokeless tobacco: Never Used  Vaping Use  . Vaping Use: Never used  Substance and Sexual Activity  . Alcohol use: Yes    Alcohol/week: 3.0 standard drinks    Types: 3 Glasses of wine per week  . Drug use: No  . Sexual activity: Not Currently  Other Topics Concern  . Not on file  Social History Narrative  . Not on file   Social Determinants of Health   Financial Resource Strain: Not on file  Food Insecurity: Not on file  Transportation Needs: Not on file  Physical Activity: Not on file  Stress: Not on file  Social Connections: Not on file  Intimate Partner Violence: Not on file      Review of Systems  Constitutional: Negative for chills, diaphoresis and fatigue.  HENT: Negative for ear pain, postnasal drip and sinus pressure.   Eyes: Negative for photophobia, discharge, redness, itching and visual disturbance.  Respiratory: Negative for cough, shortness of breath and wheezing.   Cardiovascular: Negative for chest pain, palpitations and leg swelling.  Gastrointestinal: Negative for abdominal pain, constipation, diarrhea, nausea and vomiting.  Genitourinary: Negative for dysuria and flank pain.  Musculoskeletal: Negative for arthralgias, back pain, gait problem and neck pain.  Skin: Negative for color change.  Allergic/Immunologic: Negative for environmental allergies and food allergies.  Neurological: Negative for dizziness and headaches.  Hematological: Does not bruise/bleed easily.  Psychiatric/Behavioral:  Negative for agitation, behavioral problems (depression) and hallucinations.    Vital Signs: BP 136/88   Pulse 93   Temp (!) 97.4 F (36.3 C)   Resp 16   Ht 5' 7.5" (1.715 m)   Wt 249 lb (112.9 kg)   SpO2 99%   BMI 38.42 kg/m    Physical Exam Vitals reviewed.  Constitutional:      Appearance: Normal appearance. She is obese.  Cardiovascular:     Rate and Rhythm: Normal rate and regular rhythm.      Pulses: Normal pulses.     Heart sounds: Normal heart sounds.  Pulmonary:     Effort: Pulmonary effort is normal.     Breath sounds: Normal breath sounds.  Abdominal:     General: Abdomen is flat.     Palpations: Abdomen is soft.  Musculoskeletal:        General: Normal range of motion.     Cervical back: Normal range of motion.  Skin:    General: Skin is warm.  Neurological:     General: No focal deficit present.     Mental Status: She is alert and oriented to person, place, and time. Mental status is at baseline.  Psychiatric:        Mood and Affect: Mood normal.        Behavior: Behavior normal.        Thought Content: Thought content normal.        Judgment: Judgment normal.    Assessment/Plan: 1. Essential hypertension BP and HR better controlled today--continue to monitor  2. GAD (generalized anxiety disorder) Better controlled with increased dose of Zoloft--continue to monitor  3. Class 2 obesity due to excess calories with body mass index (BMI) of 38.0 to 38.9 in adult, unspecified whether serious comorbidity present Obesity Counseling: Risk Assessment: An assessment of behavioral risk factors was made today and includes lack of exercise sedentary lifestyle, lack of portion control and poor dietary habits.  Risk Modification Advice: She was counseled on portion control guidelines. Restricting daily caloric intake to 1800. The detrimental long term effects of obesity on her health and ongoing poor compliance was also discussed with the patient.  4. Other fatigue - CBC w/Diff/Platelet - Comprehensive Metabolic Panel (CMET) - Lipid Panel With LDL/HDL Ratio - TSH + free T4  General Counseling: Twana verbalizes understanding of the findings of todays visit and agrees with plan of treatment. I have discussed any further diagnostic evaluation that may be needed or ordered today. We also reviewed her medications today. she has been encouraged to call the office with any  questions or concerns that should arise related to todays visit.    Orders Placed This Encounter  Procedures  . CBC w/Diff/Platelet  . Comprehensive Metabolic Panel (CMET)  . Lipid Panel With LDL/HDL Ratio  . TSH + free T4      Time spent: 30 Minutes Time spent includes review of chart, medications, test results and follow-up plan with the patient.  This patient was seen by Leeanne Deed AGNP-C in Collaboration with Dr Lyndon Code as a part of collaborative care agreement     Lubertha Basque. Copelan Maultsby AGNP-C Internal medicine

## 2020-09-24 ENCOUNTER — Encounter: Payer: Self-pay | Admitting: Hospice and Palliative Medicine

## 2020-09-29 ENCOUNTER — Other Ambulatory Visit: Payer: Self-pay | Admitting: Hospice and Palliative Medicine

## 2020-10-02 ENCOUNTER — Telehealth: Payer: Self-pay

## 2020-10-02 NOTE — Telephone Encounter (Signed)
Left a message and advised pt that her fmla paperwork ready for pick up,made a copy and put in scan. Jezel Basto

## 2020-10-08 ENCOUNTER — Other Ambulatory Visit: Payer: Self-pay | Admitting: Internal Medicine

## 2020-10-08 DIAGNOSIS — I10 Essential (primary) hypertension: Secondary | ICD-10-CM

## 2021-01-22 ENCOUNTER — Ambulatory Visit: Payer: BC Managed Care – PPO | Admitting: Physician Assistant

## 2021-03-12 DIAGNOSIS — L439 Lichen planus, unspecified: Secondary | ICD-10-CM | POA: Diagnosis not present

## 2021-03-12 DIAGNOSIS — L818 Other specified disorders of pigmentation: Secondary | ICD-10-CM | POA: Diagnosis not present

## 2021-05-14 DIAGNOSIS — L439 Lichen planus, unspecified: Secondary | ICD-10-CM | POA: Diagnosis not present

## 2021-05-14 DIAGNOSIS — L818 Other specified disorders of pigmentation: Secondary | ICD-10-CM | POA: Diagnosis not present

## 2021-06-19 ENCOUNTER — Encounter: Payer: Self-pay | Admitting: Nurse Practitioner

## 2021-06-19 ENCOUNTER — Other Ambulatory Visit: Payer: Self-pay

## 2021-06-19 ENCOUNTER — Ambulatory Visit (INDEPENDENT_AMBULATORY_CARE_PROVIDER_SITE_OTHER): Payer: BC Managed Care – PPO | Admitting: Nurse Practitioner

## 2021-06-19 VITALS — BP 162/92 | HR 100 | Temp 98.0°F | Resp 16 | Ht 67.5 in | Wt 248.2 lb

## 2021-06-19 DIAGNOSIS — I10 Essential (primary) hypertension: Secondary | ICD-10-CM | POA: Diagnosis not present

## 2021-06-19 DIAGNOSIS — Z6838 Body mass index (BMI) 38.0-38.9, adult: Secondary | ICD-10-CM

## 2021-06-19 DIAGNOSIS — L439 Lichen planus, unspecified: Secondary | ICD-10-CM | POA: Diagnosis not present

## 2021-06-19 DIAGNOSIS — Z113 Encounter for screening for infections with a predominantly sexual mode of transmission: Secondary | ICD-10-CM

## 2021-06-19 DIAGNOSIS — E559 Vitamin D deficiency, unspecified: Secondary | ICD-10-CM

## 2021-06-19 DIAGNOSIS — E782 Mixed hyperlipidemia: Secondary | ICD-10-CM

## 2021-06-19 DIAGNOSIS — M545 Low back pain, unspecified: Secondary | ICD-10-CM

## 2021-06-19 DIAGNOSIS — Z0001 Encounter for general adult medical examination with abnormal findings: Secondary | ICD-10-CM

## 2021-06-19 DIAGNOSIS — G8929 Other chronic pain: Secondary | ICD-10-CM

## 2021-06-19 DIAGNOSIS — R300 Dysuria: Secondary | ICD-10-CM

## 2021-06-19 DIAGNOSIS — F411 Generalized anxiety disorder: Secondary | ICD-10-CM

## 2021-06-19 MED ORDER — TRIAMCINOLONE ACETONIDE 0.1 % EX CREA: 1.0000 "application " | TOPICAL_CREAM | Freq: Two times a day (BID) | CUTANEOUS | 2 refills | Status: AC

## 2021-06-19 MED ORDER — AMLODIPINE BESYLATE 2.5 MG PO TABS: 2.5000 mg | ORAL_TABLET | Freq: Every day | ORAL | 2 refills | Status: DC

## 2021-06-19 MED ORDER — LOSARTAN POTASSIUM-HCTZ 50-12.5 MG PO TABS: 1.0000 | ORAL_TABLET | Freq: Every day | ORAL | 1 refills | Status: DC

## 2021-06-19 NOTE — Progress Notes (Signed)
[facility] [place], [place] [address]  Internal MEDICINE  Office Visit Note  Patient Name: Heather Beck  [id number]  [id number]  Date of Service: 06/19/2021  Chief Complaint  Patient presents with   Annual Exam   Anxiety   Hypertension    HPI Heather Beck presents for an annual well visit and physical exam.  She is a well-appearing 35 year old female.  She has hypertension and anxiety.  She is currently taking losartan and hydrochlorothiazide for her blood pressure but her blood pressure is not optimally controlled at this time.  For anxiety, she is taking sertraline.  She is due for a Pap smear in November next year.  She has no other preventive screenings due.  She is not due for her first routine mammogram for 4 more years. The patient is requesting a new referral for breast reduction surgery she had started the process previously but there was an issue with insurance so she is wanting to go through the process again. After discussing recommended optional screenings, patient would like to be screened for HIV and hepatitis C.    Current Medication: Outpatient Encounter Medications as of 06/19/2021  Medication Sig   amLODipine (NORVASC) 2.5 MG tablet Take 1 tablet (2.5 mg total) by mouth daily.   [DISCONTINUED] diltiazem (CARDIZEM CD) 180 MG 24 hr capsule TAKE 1 CAPSULE (180 MG TOTAL) BY MOUTH AT BEDTIME.   [DISCONTINUED] losartan-hydrochlorothiazide (HYZAAR) 50-12.5 MG tablet Take 1 tablet by mouth daily.   [DISCONTINUED] sertraline (ZOLOFT) 50 MG tablet Take one tab po in pm for GAD   [DISCONTINUED] triamcinolone cream (KENALOG) 0.1 % Apply 1 application topically 2 (two) times daily.   losartan-hydrochlorothiazide (HYZAAR) 50-12.5 MG tablet Take 1 tablet by mouth daily.   sertraline (ZOLOFT) 50 MG tablet Take one tab po in pm for GAD   triamcinolone cream (KENALOG) 0.1 % Apply 1 application topically 2 (two) times daily.   No facility-administered  encounter medications on file as of 06/19/2021.    Surgical History: Past Surgical History:  Procedure Laterality Date   NO PAST SURGERIES      Medical History: Past Medical History:  Diagnosis Date   Anxiety    Chicken pox    Hypertension     Family History: Family History  Problem Relation Age of Onset   Pancreatic cancer Maternal Aunt    Bladder Cancer Maternal Uncle     Social History   Socioeconomic History   Marital status: Single    Spouse name: Not on file   Number of children: Not on file   Years of education: Not on file   Highest education level: Not on file  Occupational History   Not on file  Tobacco Use   Smoking status: Former    Types: Cigarettes    Quit date: 05/02/2017    Years since quitting: 4.2   Smokeless tobacco: Never  Vaping Use   Vaping Use: Never used  Substance and Sexual Activity   Alcohol use: Yes    Alcohol/week: 3.0 standard drinks    Types: 3 Glasses of wine per week   Drug use: No   Sexual activity: Not Currently  Other Topics Concern   Not on file  Social History Narrative   Not on file   Social Determinants of Health   Financial Resource Strain: Not on file  Food Insecurity: Not on file  Transportation Needs: Not on file  Physical Activity: Not on file  Stress: Not on file  Social  Connections: Not on file  Intimate Partner Violence: Not on file      Review of Systems  Constitutional:  Negative for activity change, appetite change, chills, fatigue, fever and unexpected weight change.  HENT: Negative.  Negative for congestion, ear pain, rhinorrhea, sore throat and trouble swallowing.   Eyes: Negative.   Respiratory: Negative.  Negative for cough, chest tightness, shortness of breath and wheezing.   Cardiovascular: Negative.  Negative for chest pain.  Gastrointestinal: Negative.  Negative for abdominal pain, blood in stool, constipation, diarrhea, nausea and vomiting.  Endocrine: Negative.   Genitourinary:  Negative.  Negative for difficulty urinating, dysuria, frequency, hematuria and urgency.  Musculoskeletal: Negative.  Negative for arthralgias, back pain, joint swelling, myalgias and neck pain.  Skin: Negative.  Negative for rash and wound.  Allergic/Immunologic: Negative.  Negative for immunocompromised state.  Neurological: Negative.  Negative for dizziness, seizures, numbness and headaches.  Hematological: Negative.   Psychiatric/Behavioral: Negative.  Negative for behavioral problems, self-injury and suicidal ideas. The patient is not nervous/anxious.    Vital Signs: BP (!) 162/92   Pulse 100   Temp 98 F (36.7 C)   Resp 16   Ht 5' 7.5" (1.715 m)   Wt 248 lb 3.2 oz (112.6 kg)   SpO2 99%   BMI 38.30 kg/m    Physical Exam Vitals reviewed.  Constitutional:      General: She is awake. She is not in acute distress.    Appearance: Normal appearance. She is well-developed and well-groomed. She is obese. She is not ill-appearing or diaphoretic.  HENT:     Head: Normocephalic and atraumatic.     Right Ear: Tympanic membrane, ear canal and external ear normal.     Left Ear: Tympanic membrane, ear canal and external ear normal.     Nose: Nose normal. No congestion or rhinorrhea.     Mouth/Throat:     Lips: Pink.     Mouth: Mucous membranes are moist.     Pharynx: Oropharynx is clear. Uvula midline. No oropharyngeal exudate or posterior oropharyngeal erythema.  Eyes:     General: Lids are normal. Vision grossly intact. Gaze aligned appropriately. No scleral icterus.       Right eye: No discharge.        Left eye: No discharge.     Conjunctiva/sclera: Conjunctivae normal.     Pupils: Pupils are equal, round, and reactive to light.     Funduscopic exam:    Right eye: Red reflex present.        Left eye: Red reflex present. Neck:     Thyroid: No thyromegaly.     Vascular: No JVD.     Trachea: Trachea and phonation normal. No tracheal deviation.  Cardiovascular:     Rate and  Rhythm: Normal rate and regular rhythm.     Pulses: Normal pulses.     Heart sounds: Normal heart sounds, S1 normal and S2 normal. No murmur heard.   No friction rub. No gallop.  Pulmonary:     Effort: Pulmonary effort is normal. No accessory muscle usage or respiratory distress.     Breath sounds: Normal breath sounds and air entry. No stridor. No wheezing or rales.  Chest:     Chest wall: No tenderness.     Comments: Declined clinical breast exam Abdominal:     General: Bowel sounds are normal. There is no distension.     Palpations: Abdomen is soft. There is no shifting dullness, fluid wave, mass or pulsatile  mass.     Tenderness: There is no abdominal tenderness. There is no guarding or rebound.  Musculoskeletal:        General: No tenderness or deformity. Normal range of motion.     Cervical back: Normal range of motion and neck supple.  Lymphadenopathy:     Cervical: No cervical adenopathy.  Skin:    General: Skin is warm and dry.     Capillary Refill: Capillary refill takes less than 2 seconds.     Coloration: Skin is not pale.     Findings: No erythema or rash.  Neurological:     Mental Status: She is alert and oriented to person, place, and time.     Cranial Nerves: No cranial nerve deficit.     Motor: No abnormal muscle tone.     Coordination: Coordination normal.     Gait: Gait normal.     Deep Tendon Reflexes: Reflexes are normal and symmetric.  Psychiatric:        Mood and Affect: Mood and affect normal.        Behavior: Behavior normal. Behavior is cooperative.        Thought Content: Thought content normal.        Judgment: Judgment normal.       Assessment/Plan: 1. Encounter for general adult medical examination with abnormal findings Age-appropriate preventive screenings and vaccinations discussed, annual physical exam completed. Routine labs for health maintenance ordered, see below. PHM updated.   2. Essential hypertension Routine labs ordered, med  refilled. Amlodipine 2.5 mg daily added. Weight loss will help improve blood pressure. Follow up in 4 weeks.  - CBC with Differential/Platelet - CMP14+EGFR - Lipid Profile - TSH + free T4 - losartan-hydrochlorothiazide (HYZAAR) 50-12.5 MG tablet; Take 1 tablet by mouth daily.  Dispense: 90 tablet; Refill: 1 - amLODipine (NORVASC) 2.5 MG tablet; Take 1 tablet (2.5 mg total) by mouth daily.  Dispense: 30 tablet; Refill: 2  3. Mixed hyperlipidemia Routine labs ordered.  - CMP14+EGFR - Lipid Profile - TSH + free T4  4. Chronic midline low back pain without sciatica Stable manageable with OTC medications  5. Lichen planus, unspecified Stable, triamcinolone refill ordered - triamcinolone cream (KENALOG) 0.1 %; Apply 1 application topically 2 (two) times daily.  Dispense: 80 g; Refill: 2  6. Vitamin D deficiency Routine lab ordered - Vitamin D (25 hydroxy)  7. GAD (generalized anxiety disorder) Taking sertraline for anxiety, refill sent - sertraline (ZOLOFT) 50 MG tablet; Take one tab po in pm for GAD  Dispense: 90 tablet; Refill: 1  8. Encounter for screening for infections with a predominantly sexual mode of transmission Routine screening ordered. - HepB+HepC+HIV Panel  9. Class 2 severe obesity due to excess calories with serious comorbidity and body mass index (BMI) of 38.0 to 38.9 in adult (HCC) BMI is 38, weight is 248, briefly discussed diet modifications, will discuss further at follow up visit in 4 weeks.       General Counseling: Heather Beck verbalizes understanding of the findings of todays visit and agrees with plan of treatment. I have discussed any further diagnostic evaluation that may be needed or ordered today. We also reviewed her medications today. she has been encouraged to call the office with any questions or concerns that should arise related to todays visit.    Orders Placed This Encounter  Procedures   HepB+HepC+HIV Panel   CBC with Differential/Platelet    CMP14+EGFR   Lipid Profile   TSH + free T4  Vitamin D (25 hydroxy)    Meds ordered this encounter  Medications   triamcinolone cream (KENALOG) 0.1 %    Sig: Apply 1 application topically 2 (two) times daily.    Dispense:  80 g    Refill:  2   losartan-hydrochlorothiazide (HYZAAR) 50-12.5 MG tablet    Sig: Take 1 tablet by mouth daily.    Dispense:  90 tablet    Refill:  1   amLODipine (NORVASC) 2.5 MG tablet    Sig: Take 1 tablet (2.5 mg total) by mouth daily.    Dispense:  30 tablet    Refill:  2   sertraline (ZOLOFT) 50 MG tablet    Sig: Take one tab po in pm for GAD    Dispense:  90 tablet    Refill:  1    Return in about 4 weeks (around 07/17/2021) for F/U, BP check, eval new med, Andra Matsuo PCP.   Total time spent:30 Minutes Time spent includes review of chart, medications, test results, and follow up plan with the patient.   [place] Controlled Substance Database was reviewed by me.  This patient was seen by Jonetta Osgood, FNP-C in collaboration with Dr. Clayborn Bigness as a part of collaborative care agreement.  Zuleika Gallus R. Valetta Fuller, MSN, FNP-C Internal medicine

## 2021-07-05 DIAGNOSIS — E559 Vitamin D deficiency, unspecified: Secondary | ICD-10-CM | POA: Diagnosis not present

## 2021-07-05 DIAGNOSIS — I10 Essential (primary) hypertension: Secondary | ICD-10-CM | POA: Diagnosis not present

## 2021-07-05 DIAGNOSIS — Z113 Encounter for screening for infections with a predominantly sexual mode of transmission: Secondary | ICD-10-CM | POA: Diagnosis not present

## 2021-07-05 DIAGNOSIS — E782 Mixed hyperlipidemia: Secondary | ICD-10-CM | POA: Diagnosis not present

## 2021-07-06 LAB — LIPID PANEL
Chol/HDL Ratio: 2.8 ratio (ref 0.0–4.4)
Cholesterol, Total: 209 mg/dL — ABNORMAL HIGH (ref 100–199)
HDL: 76 mg/dL (ref >39)
LDL Chol Calc (NIH): 113 mg/dL — ABNORMAL HIGH (ref 0–99)
Triglycerides: 115 mg/dL (ref 0–149)
VLDL Cholesterol Cal: 20 mg/dL (ref 5–40)

## 2021-07-06 LAB — CMP14+EGFR
ALT: 27 IU/L (ref 0–32)
AST: 25 IU/L (ref 0–40)
Albumin/Globulin Ratio: 1.6 (ref 1.2–2.2)
Albumin: 4.4 g/dL (ref 3.8–4.8)
Alkaline Phosphatase: 114 IU/L (ref 44–121)
BUN/Creatinine Ratio: 17 (ref 9–23)
BUN: 11 mg/dL (ref 6–20)
Bilirubin Total: 0.4 mg/dL (ref 0.0–1.2)
CO2: 22 mmol/L (ref 20–29)
Calcium: 9 mg/dL (ref 8.7–10.2)
Chloride: 101 mmol/L (ref 96–106)
Creatinine, Ser: 0.65 mg/dL (ref 0.57–1.00)
Globulin, Total: 2.7 g/dL (ref 1.5–4.5)
Glucose: 93 mg/dL (ref 70–99)
Potassium: 4.4 mmol/L (ref 3.5–5.2)
Sodium: 137 mmol/L (ref 134–144)
Total Protein: 7.1 g/dL (ref 6.0–8.5)
eGFR: 117 mL/min/{1.73_m2} (ref >59)

## 2021-07-06 LAB — CBC WITH DIFFERENTIAL/PLATELET
Basophils Absolute: 0.1 10*3/uL (ref 0.0–0.2)
Basos: 1 %
EOS (ABSOLUTE): 0.6 10*3/uL — ABNORMAL HIGH (ref 0.0–0.4)
Eos: 7 %
Hematocrit: 38.4 % (ref 34.0–46.6)
Hemoglobin: 12.8 g/dL (ref 11.1–15.9)
Immature Grans (Abs): 0 10*3/uL (ref 0.0–0.1)
Immature Granulocytes: 0 %
Lymphocytes Absolute: 3.1 10*3/uL (ref 0.7–3.1)
Lymphs: 34 %
MCH: 29.9 pg (ref 26.6–33.0)
MCHC: 33.3 g/dL (ref 31.5–35.7)
MCV: 90 fL (ref 79–97)
Monocytes Absolute: 0.6 10*3/uL (ref 0.1–0.9)
Monocytes: 6 %
Neutrophils Absolute: 4.7 10*3/uL (ref 1.4–7.0)
Neutrophils: 52 %
Platelets: 333 10*3/uL (ref 150–450)
RBC: 4.28 x10E6/uL (ref 3.77–5.28)
RDW: 12.5 % (ref 11.7–15.4)
WBC: 9 10*3/uL (ref 3.4–10.8)

## 2021-07-06 LAB — HEPB+HEPC+HIV PANEL
HIV Screen 4th Generation wRfx: NONREACTIVE
Hep B C IgM: NEGATIVE
Hep B Core Total Ab: NEGATIVE
Hep B E Ab: NEGATIVE
Hep B E Ag: NEGATIVE
Hep B Surface Ab, Qual: REACTIVE
Hep C Virus Ab: 0.2 s/co ratio (ref 0.0–0.9)
Hepatitis B Surface Ag: NEGATIVE

## 2021-07-06 LAB — TSH+FREE T4
Free T4: 1.05 ng/dL (ref 0.82–1.77)
TSH: 1.21 u[IU]/mL (ref 0.450–4.500)

## 2021-07-06 LAB — VITAMIN D 25 HYDROXY (VIT D DEFICIENCY, FRACTURES): Vit D, 25-Hydroxy: 10.2 ng/mL — ABNORMAL LOW (ref 30.0–100.0)

## 2021-07-17 ENCOUNTER — Encounter: Payer: Self-pay | Admitting: Nurse Practitioner

## 2021-07-17 ENCOUNTER — Other Ambulatory Visit: Payer: Self-pay

## 2021-07-17 ENCOUNTER — Ambulatory Visit: Payer: BC Managed Care – PPO | Admitting: Nurse Practitioner

## 2021-07-17 VITALS — BP 130/85 | HR 98 | Temp 98.4°F | Resp 16 | Ht 67.5 in | Wt 248.6 lb

## 2021-07-17 DIAGNOSIS — E782 Mixed hyperlipidemia: Secondary | ICD-10-CM

## 2021-07-17 DIAGNOSIS — I10 Essential (primary) hypertension: Secondary | ICD-10-CM | POA: Diagnosis not present

## 2021-07-17 DIAGNOSIS — E559 Vitamin D deficiency, unspecified: Secondary | ICD-10-CM

## 2021-07-17 DIAGNOSIS — F411 Generalized anxiety disorder: Secondary | ICD-10-CM | POA: Diagnosis not present

## 2021-07-17 DIAGNOSIS — E039 Hypothyroidism, unspecified: Secondary | ICD-10-CM | POA: Insufficient documentation

## 2021-07-17 DIAGNOSIS — L439 Lichen planus, unspecified: Secondary | ICD-10-CM | POA: Insufficient documentation

## 2021-07-17 MED ORDER — VITAMIN D (ERGOCALCIFEROL) 1.25 MG (50000 UNIT) PO CAPS: 50000.0000 [IU] | ORAL_CAPSULE | ORAL | 5 refills | Status: AC

## 2021-07-17 MED ORDER — SERTRALINE HCL 50 MG PO TABS: ORAL_TABLET | ORAL | 1 refills | Status: DC

## 2021-07-17 NOTE — Progress Notes (Signed)
[facility] [address] [place], [place] [address]  Internal MEDICINE  Office Visit Note  Patient Name: Heather Beck  [id number]  [id number]  Date of Service: 07/17/2021  Chief Complaint  Patient presents with   Follow-up   Hypertension   Anxiety    HPI Heather Beck presents for a follow-up visit for hypertension and anxiety.  Her initial blood pressure was elevated but improved when rechecked.  She reports that she did drink coffee this morning so she has had some caffeine.  She reports she still has anxiety but it is manageable and she continues to take sertraline.  She is not interested in increasing the dose of her sertraline.  Labs discussed with patient this morning.  Her vitamin D level is severely low at 10.2.  Her thyroid levels were normal, her metabolic panel is also normal.  The hepatitis B, hepatitis C and HIV panel was negative.  Her CBC was grossly normal her absolute eosinophils were slightly elevated at 0.6 but patient reports she had a sinus infection when she had her labs drawn.  Her lipid panel is abnormal with an elevated total cholesterol of 209 and an elevated LDL of 113.  Her HDL was 76, VLDL was 20, and triglycerides were 115.  Her cholesterol/HDL ratio was 2.8 which is less than half the average risk for cardiovascular disease.    Current Medication: Outpatient Encounter Medications as of 07/17/2021  Medication Sig   amLODipine (NORVASC) 2.5 MG tablet Take 1 tablet (2.5 mg total) by mouth daily.   losartan-hydrochlorothiazide (HYZAAR) 50-12.5 MG tablet Take 1 tablet by mouth daily.   sertraline (ZOLOFT) 50 MG tablet Take one tab po in pm for GAD   triamcinolone cream (KENALOG) 0.1 % Apply 1 application topically 2 (two) times daily.   Vitamin D, Ergocalciferol, (DRISDOL) 1.25 MG (50000 UNIT) CAPS capsule Take 1 capsule (50,000 Units total) by mouth every 7 (seven) days.   No facility-administered encounter medications on file as of 07/17/2021.     Surgical History: Past Surgical History:  Procedure Laterality Date   NO PAST SURGERIES      Medical History: Past Medical History:  Diagnosis Date   Anxiety    Chicken pox    Hypertension     Family History: Family History  Problem Relation Age of Onset   Pancreatic cancer Maternal Aunt    Bladder Cancer Maternal Uncle     Social History   Socioeconomic History   Marital status: Single    Spouse name: Not on file   Number of children: Not on file   Years of education: Not on file   Highest education level: Not on file  Occupational History   Not on file  Tobacco Use   Smoking status: Former    Types: Cigarettes    Quit date: 05/02/2017    Years since quitting: 4.2   Smokeless tobacco: Never  Vaping Use   Vaping Use: Never used  Substance and Sexual Activity   Alcohol use: Yes    Alcohol/week: 3.0 standard drinks    Types: 3 Glasses of wine per week   Drug use: No   Sexual activity: Not Currently  Other Topics Concern   Not on file  Social History Narrative   Not on file   Social Determinants of Health   Financial Resource Strain: Not on file  Food Insecurity: Not on file  Transportation Needs: Not on file  Physical Activity: Not on file  Stress: Not on file  Social Connections: Not on file  Intimate Partner Violence: Not on file      Review of Systems  Constitutional:  Negative for chills, fatigue and unexpected weight change.  HENT:  Negative for congestion, rhinorrhea, sneezing and sore throat.   Eyes:  Negative for redness.  Respiratory:  Negative for cough, chest tightness and shortness of breath.   Cardiovascular:  Negative for chest pain and palpitations.  Gastrointestinal:  Negative for abdominal pain, constipation, diarrhea, nausea and vomiting.  Genitourinary:  Negative for dysuria and frequency.  Musculoskeletal:  Negative for arthralgias, back pain, joint swelling and neck pain.  Skin:  Negative for rash.  Neurological:  Negative.  Negative for tremors and numbness.  Hematological:  Negative for adenopathy. Does not bruise/bleed easily.  Psychiatric/Behavioral:  Negative for behavioral problems (Depression), sleep disturbance and suicidal ideas. The patient is not nervous/anxious.    Vital Signs: BP 130/85 Comment: 150/85   Pulse 98 Comment: 108   Temp 98.4 F (36.9 C)    Resp 16    Ht 5' 7.5" (1.715 m)    Wt 248 lb 9.6 oz (112.8 kg)    SpO2 100%    BMI 38.36 kg/m    Physical Exam Vitals reviewed.  Constitutional:      General: She is not in acute distress.    Appearance: Normal appearance. She is obese. She is not ill-appearing.  HENT:     Head: Normocephalic and atraumatic.  Eyes:     Pupils: Pupils are equal, round, and reactive to light.  Cardiovascular:     Rate and Rhythm: Normal rate and regular rhythm.  Pulmonary:     Effort: Pulmonary effort is normal. No respiratory distress.  Neurological:     Mental Status: She is alert and oriented to person, place, and time.     Cranial Nerves: No cranial nerve deficit.     Coordination: Coordination normal.     Gait: Gait normal.  Psychiatric:        Mood and Affect: Mood normal.        Behavior: Behavior normal.       Assessment/Plan: 1. Essential hypertension Blood pressure is well controlled with amlodipine and losartan-hydrochlorothiazide. She does not need any refills today.   2. Vitamin D deficiency Due to significantly low vitamin D level, will start vitamin D 50,000 unit capsule, 1 capsule by mouth weekly. Prescription sent to pharmacy. Will repeat vitamin D level in 3-6 months.  - Vitamin D, Ergocalciferol, (DRISDOL) 1.25 MG (50000 UNIT) CAPS capsule; Take 1 capsule (50,000 Units total) by mouth every 7 (seven) days.  Dispense: 5 capsule; Refill: 5  3. Mixed hyperlipidemia Diet and lifestyle modifications discussed to help improve cholesterol levels.   4. GAD (generalized anxiety disorder) Patient reports that her anxiety is  manageable with the current dose of sertraline, continue as prescribed.   General Counseling: Heather Beck verbalizes understanding of the findings of todays visit and agrees with plan of treatment. I have discussed any further diagnostic evaluation that may be needed or ordered today. We also reviewed her medications today. she has been encouraged to call the office with any questions or concerns that should arise related to todays visit.    No orders of the defined types were placed in this encounter.    Meds ordered this encounter  Medications   Vitamin D, Ergocalciferol, (DRISDOL) 1.25 MG (50000 UNIT) CAPS capsule    Sig: Take 1 capsule (50,000 Units total) by mouth every 7 (seven) days.  Dispense:  5 capsule    Refill:  5    Return in about 6 months (around 01/15/2022) for F/U, BP check recheck vitamin D , Misao Fackrell PCP.   Total time spent:30 Minutes Time spent includes review of chart, medications, test results, and follow up plan with the patient.   [place] Controlled Substance Database was reviewed by me.  This patient was seen by Sallyanne Kuster, FNP-C in collaboration with Dr. Beverely Risen as a part of collaborative care agreement.   Merriel Zinger R. Tedd Sias, MSN, FNP-C Internal medicine

## 2021-08-16 ENCOUNTER — Encounter: Payer: Self-pay | Admitting: Nurse Practitioner

## 2021-08-16 ENCOUNTER — Other Ambulatory Visit: Payer: Self-pay

## 2021-08-16 ENCOUNTER — Ambulatory Visit: Payer: BC Managed Care – PPO | Admitting: Nurse Practitioner

## 2021-08-16 VITALS — BP 142/92 | HR 82 | Temp 98.3°F | Resp 16 | Ht 67.5 in | Wt 245.8 lb

## 2021-08-16 DIAGNOSIS — R300 Dysuria: Secondary | ICD-10-CM | POA: Diagnosis not present

## 2021-08-16 DIAGNOSIS — N898 Other specified noninflammatory disorders of vagina: Secondary | ICD-10-CM | POA: Diagnosis not present

## 2021-08-16 DIAGNOSIS — N3000 Acute cystitis without hematuria: Secondary | ICD-10-CM | POA: Diagnosis not present

## 2021-08-16 LAB — POCT URINALYSIS DIPSTICK
Bilirubin, UA: NEGATIVE
Glucose, UA: NEGATIVE
Nitrite, UA: NEGATIVE
Protein, UA: POSITIVE — AB
Spec Grav, UA: 1.015 (ref 1.010–1.025)
Urobilinogen, UA: 0.2 E.U./dL
pH, UA: 7.5 (ref 5.0–8.0)

## 2021-08-16 MED ORDER — FLUCONAZOLE 150 MG PO TABS: 150.0000 mg | ORAL_TABLET | Freq: Once | ORAL | 0 refills | Status: DC

## 2021-08-16 MED ORDER — NITROFURANTOIN MONOHYD MACRO 100 MG PO CAPS: 100.0000 mg | ORAL_CAPSULE | Freq: Two times a day (BID) | ORAL | 0 refills | Status: AC

## 2021-08-16 NOTE — Progress Notes (Signed)
[facility] [place], [place] [address]  Internal MEDICINE  Office Visit Note  Patient Name: Heather Beck  [id number]  [id number]  Date of Service: 08/16/2021  Chief Complaint  Patient presents with   Acute Visit   Urinary Tract Infection    Past couple days it would burn when pt started urinating but that has gone away, some itchiness, some pelvic pressure     Urinary Tract Infection  Pertinent negatives include no chills, flank pain, frequency, hematuria, nausea, urgency or vomiting.  Heather Beck presents for an acute sick visit for possible UTI. She has had burning with urination for the past couple of days with some vaginal itching and suprapubic pressure.She denies urinary urgency, urinary frequency, flank pain, back pain, fever or chills. Urinalysis was positive for leukocytes and trace blood.      Current Medication:  Outpatient Encounter Medications as of 08/16/2021  Medication Sig   amLODipine (NORVASC) 2.5 MG tablet Take 1 tablet (2.5 mg total) by mouth daily.   fluconazole (DIFLUCAN) 150 MG tablet Take 1 tablet (150 mg total) by mouth once for 1 dose. May take an additional dose after 3 days if still symptomatic.   losartan-hydrochlorothiazide (HYZAAR) 50-12.5 MG tablet Take 1 tablet by mouth daily.   nitrofurantoin, macrocrystal-monohydrate, (MACROBID) 100 MG capsule Take 1 capsule (100 mg total) by mouth 2 (two) times daily for 10 days.   sertraline (ZOLOFT) 50 MG tablet Take one tab po in pm for GAD   triamcinolone cream (KENALOG) 0.1 % Apply 1 application topically 2 (two) times daily.   Vitamin D, Ergocalciferol, (DRISDOL) 1.25 MG (50000 UNIT) CAPS capsule Take 1 capsule (50,000 Units total) by mouth every 7 (seven) days.   No facility-administered encounter medications on file as of 08/16/2021.      Medical History: Past Medical History:  Diagnosis Date   Anxiety    Chicken pox    Hypertension      Vital Signs: BP (!) 142/92     Pulse 82    Temp 98.3 F (36.8 C)    Resp 16    Ht 5' 7.5" (1.715 m)    Wt 245 lb 12.8 oz (111.5 kg)    SpO2 99%    BMI 37.93 kg/m    Review of Systems  Constitutional:  Negative for chills, fatigue and unexpected weight change.  HENT:  Negative for congestion, rhinorrhea, sneezing and sore throat.   Eyes:  Negative for redness.  Respiratory:  Negative for cough, chest tightness and shortness of breath.   Cardiovascular:  Negative for chest pain and palpitations.  Gastrointestinal:  Negative for abdominal pain, constipation, diarrhea, nausea and vomiting.  Genitourinary:  Positive for dysuria. Negative for flank pain, frequency, hematuria and urgency.  Musculoskeletal:  Negative for arthralgias, back pain, joint swelling and neck pain.  Skin:  Negative for rash.  Neurological: Negative.  Negative for tremors and numbness.  Hematological:  Negative for adenopathy. Does not bruise/bleed easily.  Psychiatric/Behavioral:  Negative for behavioral problems (Depression), sleep disturbance and suicidal ideas. The patient is not nervous/anxious.    Physical Exam Vitals reviewed.  Constitutional:      Appearance: Normal appearance. She is obese.  HENT:     Head: Normocephalic and atraumatic.  Eyes:     Pupils: Pupils are equal, round, and reactive to light.  Cardiovascular:     Rate and Rhythm: Normal rate and regular rhythm.  Pulmonary:     Effort: Pulmonary effort is normal. No respiratory distress.  Neurological:     Mental Status: She is alert and oriented to person, place, and time.  Psychiatric:        Mood and Affect: Mood normal.        Behavior: Behavior normal.      Assessment/Plan: 1. Acute cystitis without hematuria Empiric antibiotic treatment prescribed - nitrofurantoin, macrocrystal-monohydrate, (MACROBID) 100 MG capsule; Take 1 capsule (100 mg total) by mouth 2 (two) times daily for 10 days.  Dispense: 20 capsule; Refill: 0  2. Vaginal itching Possible yeast  infection, fluconazole prescribed to treat - fluconazole (DIFLUCAN) 150 MG tablet; Take 1 tablet (150 mg total) by mouth once for 1 dose. May take an additional dose after 3 days if still symptomatic.  Dispense: 1 tablet; Refill: 0  3. Dysuria Urinalysis positive for leukocytes and trace blood. Sent for culture.  - POCT Urinalysis Dipstick - CULTURE, URINE COMPREHENSIVE   General Counseling: Heather Beck verbalizes understanding of the findings of todays visit and agrees with plan of treatment. I have discussed any further diagnostic evaluation that may be needed or ordered today. We also reviewed her medications today. she has been encouraged to call the office with any questions or concerns that should arise related to todays visit.    Counseling:    Orders Placed This Encounter  Procedures   CULTURE, URINE COMPREHENSIVE   POCT Urinalysis Dipstick    Meds ordered this encounter  Medications   nitrofurantoin, macrocrystal-monohydrate, (MACROBID) 100 MG capsule    Sig: Take 1 capsule (100 mg total) by mouth 2 (two) times daily for 10 days.    Dispense:  20 capsule    Refill:  0   fluconazole (DIFLUCAN) 150 MG tablet    Sig: Take 1 tablet (150 mg total) by mouth once for 1 dose. May take an additional dose after 3 days if still symptomatic.    Dispense:  1 tablet    Refill:  0    Return if symptoms worsen or fail to improve.  [place] Controlled Substance Database was reviewed by me for overdose risk score (ORS)  Time spent:15 Minutes Time spent with patient included reviewing progress notes, labs, imaging studies, and discussing plan for follow up.   This patient was seen by Jonetta Osgood, FNP-C in collaboration with Dr. Clayborn Bigness as a part of collaborative care agreement.  Iman Reinertsen R. Valetta Fuller, MSN, FNP-C Internal Medicine

## 2021-08-19 ENCOUNTER — Other Ambulatory Visit: Payer: Self-pay | Admitting: Nurse Practitioner

## 2021-08-19 DIAGNOSIS — N898 Other specified noninflammatory disorders of vagina: Secondary | ICD-10-CM

## 2021-08-19 LAB — CULTURE, URINE COMPREHENSIVE

## 2021-08-19 NOTE — Telephone Encounter (Signed)
Med sent.

## 2021-08-28 DIAGNOSIS — L818 Other specified disorders of pigmentation: Secondary | ICD-10-CM | POA: Diagnosis not present

## 2021-08-28 DIAGNOSIS — L439 Lichen planus, unspecified: Secondary | ICD-10-CM | POA: Diagnosis not present

## 2021-08-29 ENCOUNTER — Telehealth: Payer: Self-pay

## 2021-08-29 DIAGNOSIS — N898 Other specified noninflammatory disorders of vagina: Secondary | ICD-10-CM

## 2021-08-29 MED ORDER — FLUCONAZOLE 150 MG PO TABS: 150.0000 mg | ORAL_TABLET | Freq: Once | ORAL | 0 refills | Status: AC

## 2021-08-31 NOTE — Telephone Encounter (Signed)
Spoke to pt she is feeling a little bit better, she took the last med today, still has itchiness, mostly in the morning but during the day it is not really noticeable. Does she need more meds?

## 2021-09-10 ENCOUNTER — Encounter: Payer: Self-pay | Admitting: Nurse Practitioner

## 2021-09-10 ENCOUNTER — Ambulatory Visit: Payer: BC Managed Care – PPO | Admitting: Nurse Practitioner

## 2021-09-10 ENCOUNTER — Other Ambulatory Visit: Payer: Self-pay

## 2021-09-10 VITALS — BP 140/80 | HR 98 | Temp 98.5°F | Resp 16 | Ht 67.5 in | Wt 248.4 lb

## 2021-09-10 DIAGNOSIS — I10 Essential (primary) hypertension: Secondary | ICD-10-CM

## 2021-09-10 DIAGNOSIS — M25551 Pain in right hip: Secondary | ICD-10-CM | POA: Diagnosis not present

## 2021-09-10 DIAGNOSIS — Z6838 Body mass index (BMI) 38.0-38.9, adult: Secondary | ICD-10-CM

## 2021-09-10 DIAGNOSIS — F411 Generalized anxiety disorder: Secondary | ICD-10-CM

## 2021-09-10 NOTE — Progress Notes (Signed)
[facility] [address] [place], [place] [address]  Internal MEDICINE  Office Visit Note  Patient Name: Heather Beck  [id number]  [id number]  Date of Service: 09/10/2021  Chief Complaint  Patient presents with   Follow-up    FMLA paperwork, pain in right hip off and on for a couple weeks   Hypertension   Anxiety    HPI [name] presents for a follow up visit for hypertension and right hip pain. She also has generalized anxiety disorder and has intermittent FMLA which she gets renewed on a yearly basis. She brought the paperwork to the office visit and needs it fills out by the ned of February.  She has had right hip pain off and on for a couple of weeks. She denies any injuries or falls. She has been using OTC salonpas which helps.    Current Medication: Outpatient Encounter Medications as of 09/10/2021  Medication Sig   amLODipine (NORVASC) 2.5 MG tablet Take 1 tablet (2.5 mg total) by mouth daily.   losartan-hydrochlorothiazide (HYZAAR) 50-12.5 MG tablet Take 1 tablet by mouth daily.   sertraline (ZOLOFT) 50 MG tablet Take one tab po in pm for GAD   triamcinolone cream (KENALOG) 0.1 % Apply 1 application topically 2 (two) times daily.   Vitamin D, Ergocalciferol, (DRISDOL) 1.25 MG (50000 UNIT) CAPS capsule Take 1 capsule (50,000 Units total) by mouth every 7 (seven) days.   No facility-administered encounter medications on file as of 09/10/2021.    Surgical History: Past Surgical History:  Procedure Laterality Date   NO PAST SURGERIES      Medical History: Past Medical History:  Diagnosis Date   Anxiety    Chicken pox    Hypertension     Family History: Family History  Problem Relation Age of Onset   Pancreatic cancer Maternal Aunt    Bladder Cancer Maternal Uncle     Social History   Socioeconomic History   Marital status: Single    Spouse name: Not on file   Number of children: Not on file   Years of education: Not on file   Highest  education level: Not on file  Occupational History   Not on file  Tobacco Use   Smoking status: Former    Types: Cigarettes    Quit date: 05/02/2017    Years since quitting: 4.3   Smokeless tobacco: Never  Vaping Use   Vaping Use: Never used  Substance and Sexual Activity   Alcohol use: Yes    Alcohol/week: 3.0 standard drinks    Types: 3 Glasses of wine per week   Drug use: No   Sexual activity: Not Currently  Other Topics Concern   Not on file  Social History Narrative   Not on file   Social Determinants of Health   Financial Resource Strain: Not on file  Food Insecurity: Not on file  Transportation Needs: Not on file  Physical Activity: Not on file  Stress: Not on file  Social Connections: Not on file  Intimate Partner Violence: Not on file      Review of Systems  Constitutional:  Negative for chills, fatigue and unexpected weight change.  HENT:  Negative for congestion, rhinorrhea, sneezing and sore throat.   Eyes:  Negative for redness.  Respiratory:  Negative for cough, chest tightness and shortness of breath.   Cardiovascular:  Negative for chest pain and palpitations.  Gastrointestinal:  Negative for abdominal pain, constipation, diarrhea, nausea and vomiting.  Genitourinary:  Negative for  dysuria and frequency.  Musculoskeletal:  Negative for arthralgias, back pain, joint swelling and neck pain.  Skin:  Negative for rash.  Neurological: Negative.  Negative for tremors and numbness.  Hematological:  Negative for adenopathy. Does not bruise/bleed easily.  Psychiatric/Behavioral:  Negative for behavioral problems (Depression), sleep disturbance and suicidal ideas. The patient is not nervous/anxious.    Vital Signs: BP (!) 150/88    Pulse (!) 102    Temp 98.5 F (36.9 C)    Resp 16    Ht 5' 7.5" (1.715 m)    Wt 248 lb 6.4 oz (112.7 kg)    SpO2 99%    BMI 38.33 kg/m    Physical Exam Vitals reviewed.  Constitutional:      General: She is not in acute  distress.    Appearance: Normal appearance. She is obese. She is not ill-appearing.  HENT:     Head: Normocephalic and atraumatic.  Eyes:     Pupils: Pupils are equal, round, and reactive to light.  Cardiovascular:     Rate and Rhythm: Normal rate and regular rhythm.  Pulmonary:     Effort: Pulmonary effort is normal. No respiratory distress.  Neurological:     Mental Status: She is alert and oriented to person, place, and time.     Cranial Nerves: No cranial nerve deficit.     Coordination: Coordination normal.     Gait: Gait normal.  Psychiatric:        Mood and Affect: Mood normal.        Behavior: Behavior normal.       Assessment/Plan: 1. Acute right hip pain Xray of hip ordered to rule out pathological fracture or other skeletal abnormality. Continue using OTC pain medication, salonpas, may use heat or ice  to alleviate pain as well consider ortho referral pending hip xray results.  - DG Hip Unilat W OR W/O Pelvis 2-3 Views Right; Future  2. Essential hypertension Stable, continue current medication as prescribed.   3. GAD (generalized anxiety disorder) Stable with current medication, received FMLA paperwork to complete by the end of February.   4. Class 2 severe obesity due to excess calories with serious comorbidity and body mass index (BMI) of 38.0 to 38.9 in adult [facility]) Patient gained 3 lbs since previous office visit, weight loss would be beneficial to her blood pressure and may alleviate some of her hip pain  if it is related to arthritis. Will discuss diet modifications and lifestyle modifications further at her next office visit.    General Counseling: [name] verbalizes understanding of the findings of todays visit and agrees with plan of treatment. I have discussed any further diagnostic evaluation that may be needed or ordered today. We also reviewed her medications today. she has been encouraged to call the office with any questions or concerns that should arise  related to todays visit.    Orders Placed This Encounter  Procedures   DG Hip Unilat W OR W/O Pelvis 2-3 Views Right    No orders of the defined types were placed in this encounter.   Return for previously scheduled in june, Vennela Jutte PCP.   Total time spent:30 Minutes Time spent includes review of chart, medications, test results, and follow up plan with the patient.   [place] Controlled Substance Database was reviewed by me.  This patient was seen by Sallyanne Kuster, FNP-C in collaboration with Dr. Beverely Risen as a part of collaborative care agreement.   Tory Mckissack R. Tedd Sias, MSN, FNP-C  Internal medicine

## 2021-09-11 ENCOUNTER — Other Ambulatory Visit: Payer: Self-pay | Admitting: Nurse Practitioner

## 2021-09-11 DIAGNOSIS — I10 Essential (primary) hypertension: Secondary | ICD-10-CM

## 2021-09-18 DIAGNOSIS — N898 Other specified noninflammatory disorders of vagina: Secondary | ICD-10-CM | POA: Diagnosis not present

## 2021-09-18 DIAGNOSIS — N926 Irregular menstruation, unspecified: Secondary | ICD-10-CM | POA: Diagnosis not present

## 2021-09-28 ENCOUNTER — Telehealth: Payer: Self-pay

## 2021-09-28 NOTE — Telephone Encounter (Signed)
FMLA paperwork completed by provider and faxed back to [phone]. Copy placed on deshas desk, in [place] office and in scan.

## 2021-10-22 IMAGING — CR DG CERVICAL SPINE COMPLETE 4+V
1 series · 6 of 6 positions shown · non-contrast
Comparison: None.

CLINICAL DATA: Paresthesia of the left shoulder and upper back.

EXAM:
CERVICAL SPINE - COMPLETE 4+ VIEW

[Series 1: dg cervical spine complete · 0.14mm/px · 6 of 6 slices shown]
[im 1/6]
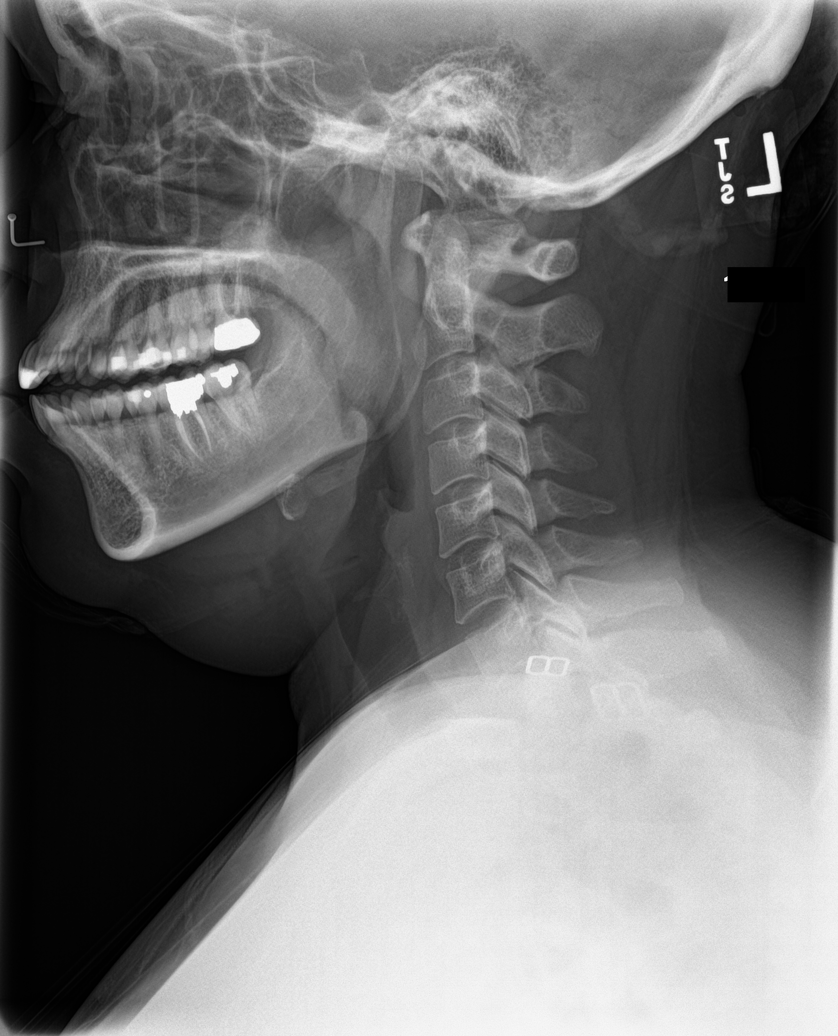
[im 2/6]
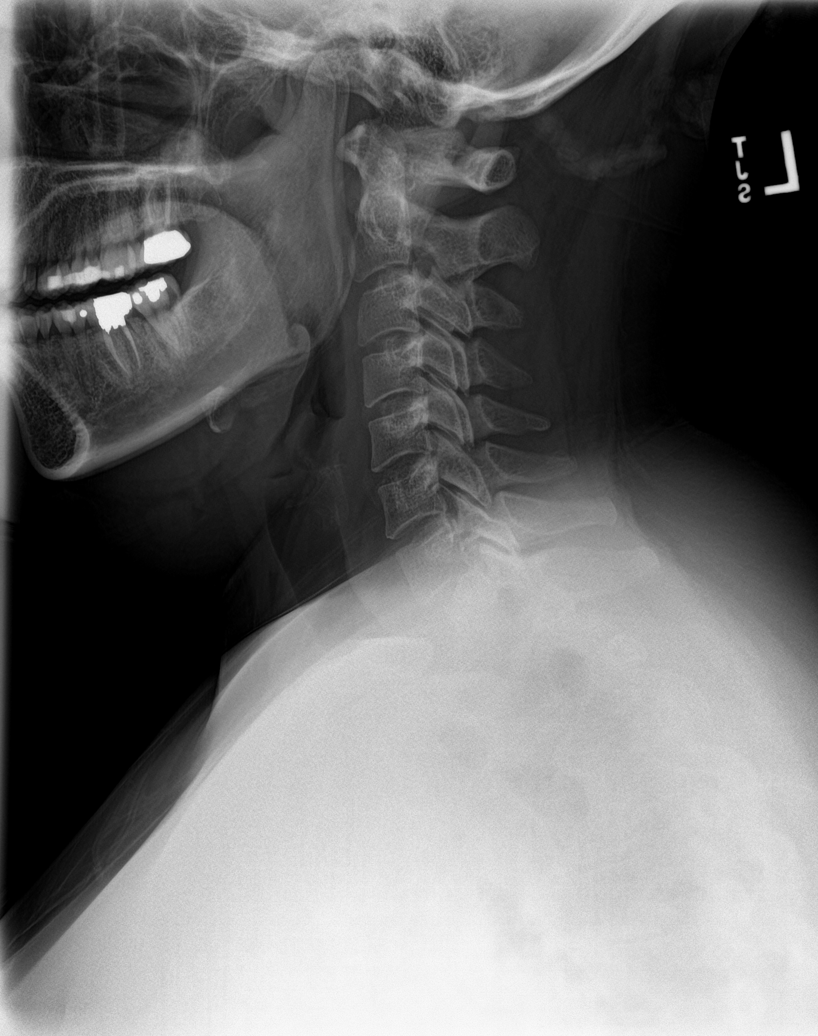
[im 3/6]
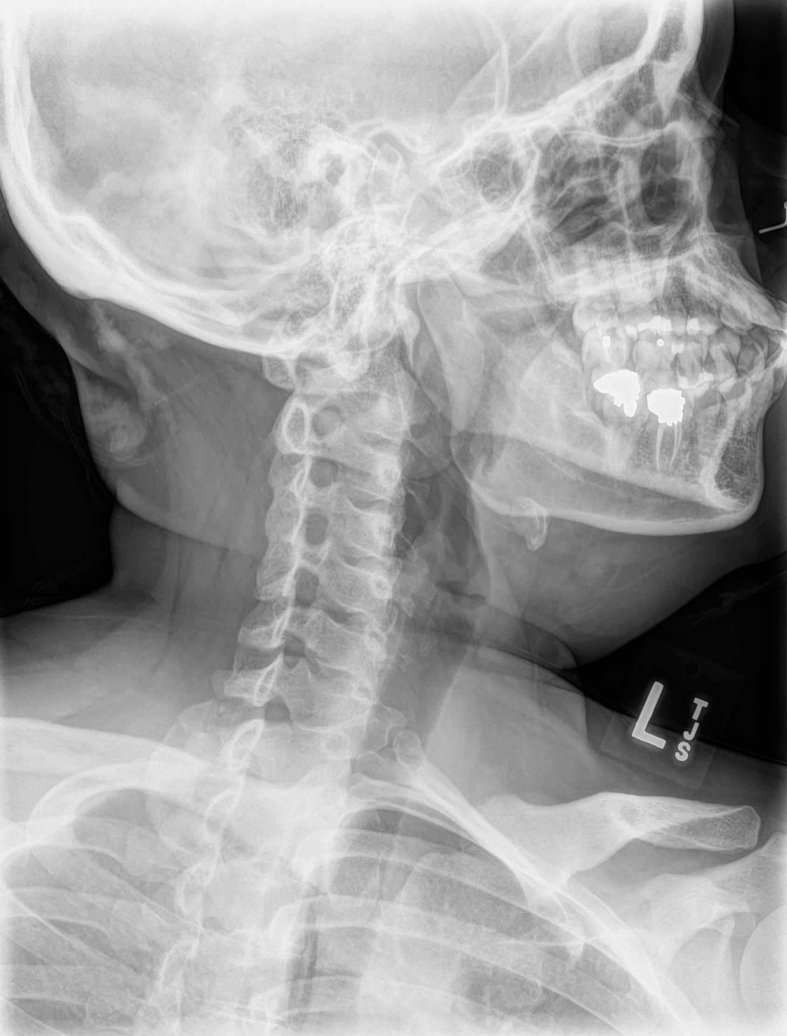
[im 4/6]
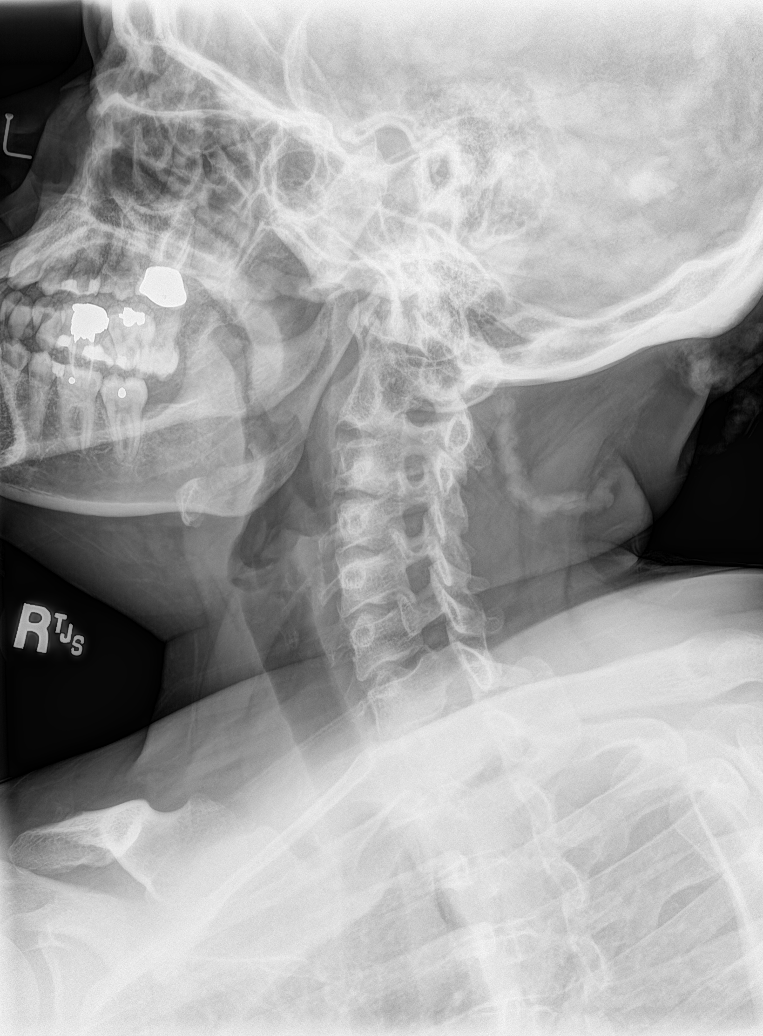
[im 5/6]
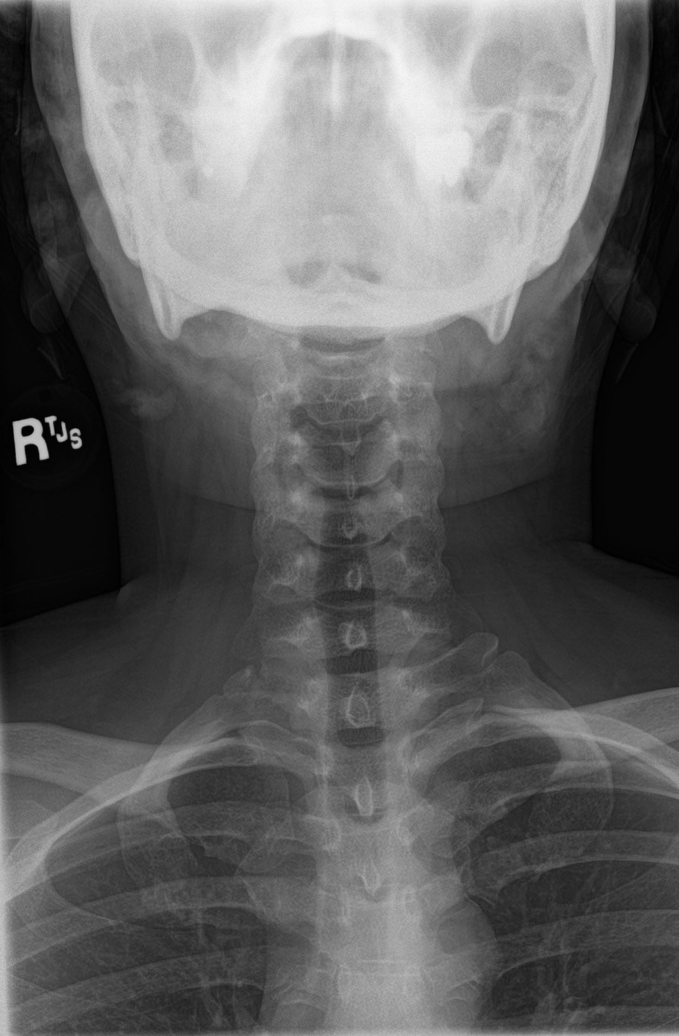
[im 6/6]
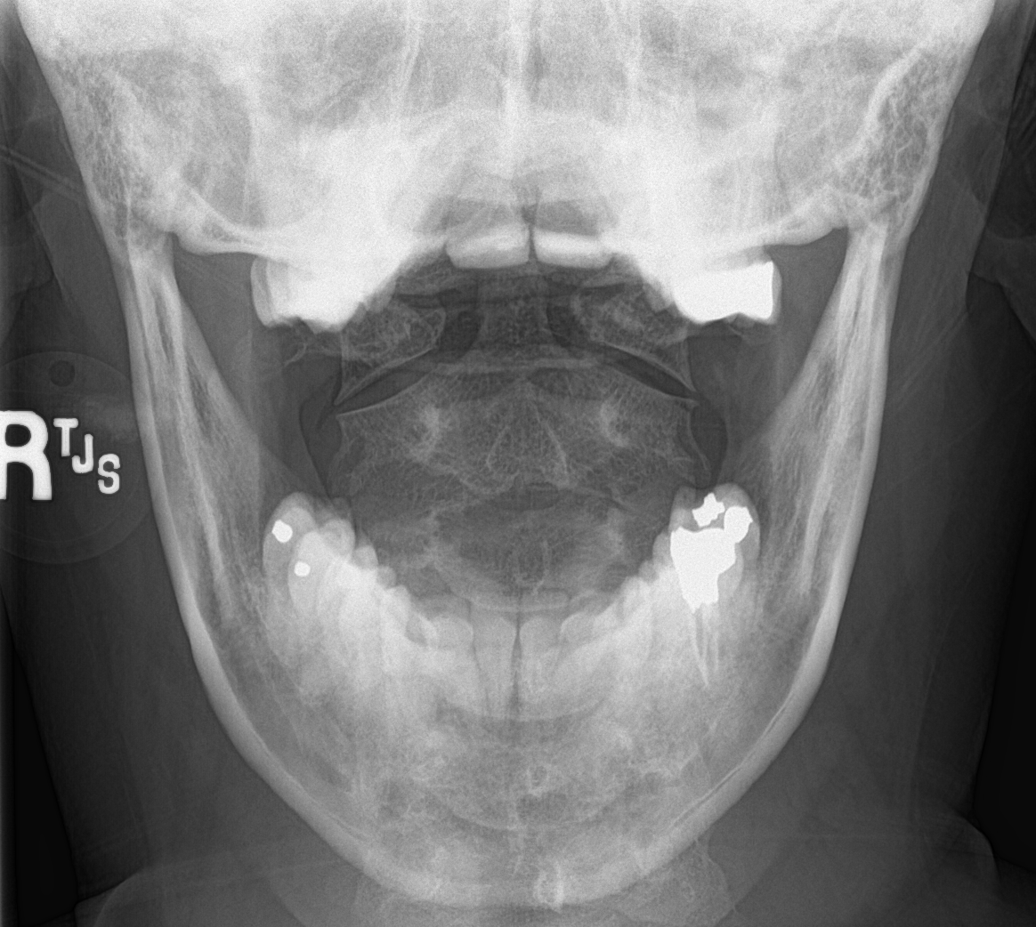

[6 of 6 positions shown; findings below may reference images not displayed]

FINDINGS: There is no evidence of cervical spine fracture or prevertebral soft
tissue swelling. Alignment is normal. No other significant bone
abnormalities are identified. No bony foraminal stenosis.
IMPRESSION: Normal cervical radiographs.

## 2022-01-14 ENCOUNTER — Ambulatory Visit: Payer: BC Managed Care – PPO | Admitting: Nurse Practitioner

## 2022-01-29 ENCOUNTER — Ambulatory Visit: Payer: BC Managed Care – PPO | Admitting: Nurse Practitioner

## 2022-06-06 ENCOUNTER — Encounter: Payer: BC Managed Care – PPO | Admitting: Nurse Practitioner

## 2022-06-24 ENCOUNTER — Encounter: Payer: BC Managed Care – PPO | Admitting: Nurse Practitioner

## 2022-07-11 ENCOUNTER — Telehealth: Payer: Self-pay | Admitting: Internal Medicine

## 2022-07-11 NOTE — Telephone Encounter (Signed)
Left vm and sent mychart message to confirm 07/18/22 appointment-Toni 

## 2022-07-18 ENCOUNTER — Encounter: Payer: BC Managed Care – PPO | Admitting: Nurse Practitioner

## 2022-08-21 ENCOUNTER — Encounter: Payer: BC Managed Care – PPO | Admitting: Nurse Practitioner

## 2022-09-23 ENCOUNTER — Ambulatory Visit (INDEPENDENT_AMBULATORY_CARE_PROVIDER_SITE_OTHER): Payer: BC Managed Care – PPO | Admitting: Nurse Practitioner

## 2022-09-23 ENCOUNTER — Encounter: Payer: Self-pay | Admitting: Nurse Practitioner

## 2022-09-23 VITALS — BP 160/85 | HR 90 | Temp 98.0°F | Resp 16 | Ht 67.5 in | Wt 249.8 lb

## 2022-09-23 DIAGNOSIS — I10 Essential (primary) hypertension: Secondary | ICD-10-CM

## 2022-09-23 DIAGNOSIS — E782 Mixed hyperlipidemia: Secondary | ICD-10-CM | POA: Diagnosis not present

## 2022-09-23 DIAGNOSIS — Z0001 Encounter for general adult medical examination with abnormal findings: Secondary | ICD-10-CM

## 2022-09-23 DIAGNOSIS — F411 Generalized anxiety disorder: Secondary | ICD-10-CM | POA: Diagnosis not present

## 2022-09-23 DIAGNOSIS — E559 Vitamin D deficiency, unspecified: Secondary | ICD-10-CM | POA: Diagnosis not present

## 2022-09-23 MED ORDER — HYDROXYZINE HCL 25 MG PO TABS: 25.0000 mg | ORAL_TABLET | Freq: Three times a day (TID) | ORAL | 1 refills | Status: DC | PRN

## 2022-09-23 MED ORDER — LOSARTAN POTASSIUM-HCTZ 100-12.5 MG PO TABS: 1.0000 | ORAL_TABLET | Freq: Every day | ORAL | 1 refills | Status: DC

## 2022-09-23 NOTE — Progress Notes (Signed)
[facility] [place], [place] [address]  Internal MEDICINE  Office Visit Note  Patient Name: Heather Beck  [id number]  [id number]  Date of Service: 09/23/2022  Chief Complaint  Patient presents with   Hypertension   Annual Exam   Quality Metric Gaps    Pap smear     HPI [place] presents for an annual well visit and physical exam.  Well-appearing 37 y.o. female with anxiety, low back pain and hypertension  Pap smear: due next year  Labs:  due for routine labs  New or worsening pain: none  Other concerns: has paperwork to renew intermittent FMLA.  BP elevated but improved when rechecked.      09/23/2022    1:23 PM  Depression screen PHQ 2/9  Decreased Interest 1  Down, Depressed, Hopeless 2  PHQ - 2 Score 3  Altered sleeping 2  Tired, decreased energy 0  Change in appetite 0  Feeling bad or failure about yourself  1  Trouble concentrating 1  Moving slowly or fidgety/restless 0  Suicidal thoughts 0  PHQ-9 Score 7  Difficult doing work/chores Somewhat difficult    Functional Status Survey: Is the patient deaf or have difficulty hearing?: No Does the patient have difficulty seeing, even when wearing glasses/contacts?: No Does the patient have difficulty concentrating, remembering, or making decisions?: Yes Does the patient have difficulty walking or climbing stairs?: No Does the patient have difficulty dressing or bathing?: No Does the patient have difficulty doing errands alone such as visiting a doctor's office or shopping?: No      06/19/2020    9:21 AM 09/21/2020   11:37 AM 06/19/2021    9:07 AM 09/10/2021    9:26 AM 09/23/2022    1:23 PM  [place] in the past year? 0 0 0 0 0  Was there an injury with Fall?     0  Fall Risk Category Calculator     0  (RETIRED) Patient Fall Risk Level   Low fall risk Low fall risk   Patient at Risk for Falls Due to  No Fall Risks No Fall Risks No Fall Risks No Fall Risks  Fall risk Follow up   Falls evaluation completed Falls evaluation completed Falls evaluation completed Falls evaluation completed      Current Medication: Outpatient Encounter Medications as of 09/23/2022  Medication Sig   hydrOXYzine (ATARAX) 25 MG tablet Take 1 tablet (25 mg total) by mouth 3 (three) times daily as needed for anxiety.   losartan-hydrochlorothiazide (HYZAAR) 100-12.5 MG tablet Take 1 tablet by mouth daily.   triamcinolone cream (KENALOG) 0.1 % Apply 1 application topically 2 (two) times daily.   Vitamin D, Ergocalciferol, (DRISDOL) 1.25 MG (50000 UNIT) CAPS capsule Take 1 capsule (50,000 Units total) by mouth every 7 (seven) days.   [DISCONTINUED] amLODipine (NORVASC) 2.5 MG tablet TAKE 1 TABLET BY MOUTH EVERY DAY   [DISCONTINUED] losartan-hydrochlorothiazide (HYZAAR) 50-12.5 MG tablet Take 1 tablet by mouth daily.   [DISCONTINUED] sertraline (ZOLOFT) 50 MG tablet Take one tab po in pm for GAD   No facility-administered encounter medications on file as of 09/23/2022.    Surgical History: Past Surgical History:  Procedure Laterality Date   NO PAST SURGERIES      Medical History: Past Medical History:  Diagnosis Date   Anxiety    Chicken pox    Hypertension     Family History: Family History  Problem Relation Age of Onset   Pancreatic cancer  Maternal Aunt    Bladder Cancer Maternal Uncle     Social History   Socioeconomic History   Marital status: Single    Spouse name: Not on file   Number of children: Not on file   Years of education: Not on file   Highest education level: Not on file  Occupational History   Not on file  Tobacco Use   Smoking status: Former    Types: Cigarettes    Quit date: 05/02/2017    Years since quitting: 5.4   Smokeless tobacco: Never  Vaping Use   Vaping Use: Never used  Substance and Sexual Activity   Alcohol use: Yes    Alcohol/week: 3.0 standard drinks of alcohol    Types: 3 Glasses of wine per week   Drug use: No   Sexual activity:  Not Currently  Other Topics Concern   Not on file  Social History Narrative   Not on file   Social Determinants of Health   Financial Resource Strain: Not on file  Food Insecurity: Not on file  Transportation Needs: Not on file  Physical Activity: Not on file  Stress: Not on file  Social Connections: Not on file  Intimate Partner Violence: Not on file      Review of Systems  Constitutional:  Negative for activity change, appetite change, chills, fatigue, fever and unexpected weight change.  HENT: Negative.  Negative for congestion, ear pain, rhinorrhea, sore throat and trouble swallowing.   Eyes: Negative.   Respiratory: Negative.  Negative for cough, chest tightness, shortness of breath and wheezing.   Cardiovascular: Negative.  Negative for chest pain.  Gastrointestinal: Negative.  Negative for abdominal pain, blood in stool, constipation, diarrhea, nausea and vomiting.  Endocrine: Negative.   Genitourinary: Negative.  Negative for difficulty urinating, dysuria, frequency, hematuria and urgency.  Musculoskeletal: Negative.  Negative for arthralgias, back pain, joint swelling, myalgias and neck pain.  Skin: Negative.  Negative for rash and wound.  Allergic/Immunologic: Negative.  Negative for immunocompromised state.  Neurological: Negative.  Negative for dizziness, seizures, numbness and headaches.  Hematological: Negative.   Psychiatric/Behavioral: Negative.  Negative for behavioral problems, self-injury and suicidal ideas. The patient is not nervous/anxious.     Vital Signs: BP (!) 160/85 Comment: 176/88  Pulse 90   Temp 98 F (36.7 C)   Resp 16   Ht 5' 7.5" (1.715 m)   Wt 249 lb 12.8 oz (113.3 kg)   SpO2 100%   BMI 38.55 kg/m    Physical Exam Vitals reviewed.  Constitutional:      General: She is awake. She is not in acute distress.    Appearance: Normal appearance. She is well-developed and well-groomed. She is obese. She is not ill-appearing or diaphoretic.   HENT:     Head: Normocephalic and atraumatic.     Right Ear: Tympanic membrane, ear canal and external ear normal.     Left Ear: Tympanic membrane, ear canal and external ear normal.     Nose: Nose normal. No congestion or rhinorrhea.     Mouth/Throat:     Lips: Pink.     Mouth: Mucous membranes are moist.     Pharynx: Oropharynx is clear. Uvula midline. No oropharyngeal exudate or posterior oropharyngeal erythema.  Eyes:     General: Lids are normal. Vision grossly intact. Gaze aligned appropriately. No scleral icterus.       Right eye: No discharge.        Left eye: No discharge.  Conjunctiva/sclera: Conjunctivae normal.     Pupils: Pupils are equal, round, and reactive to light.     Funduscopic exam:    Right eye: Red reflex present.        Left eye: Red reflex present. Neck:     Thyroid: No thyromegaly.     Vascular: No JVD.     Trachea: Trachea and phonation normal. No tracheal deviation.  Cardiovascular:     Rate and Rhythm: Normal rate and regular rhythm.     Pulses: Normal pulses.     Heart sounds: Normal heart sounds, S1 normal and S2 normal. No murmur heard.    No friction rub. No gallop.  Pulmonary:     Effort: Pulmonary effort is normal. No accessory muscle usage or respiratory distress.     Breath sounds: Normal breath sounds and air entry. No stridor. No wheezing or rales.  Chest:     Chest wall: No tenderness.     Comments: Declined clinical breast exam Abdominal:     General: Bowel sounds are normal. There is no distension.     Palpations: Abdomen is soft. There is no shifting dullness, fluid wave, mass or pulsatile mass.     Tenderness: There is no abdominal tenderness. There is no guarding or rebound.  Musculoskeletal:        General: No tenderness or deformity. Normal range of motion.     Cervical back: Normal range of motion and neck supple.  Lymphadenopathy:     Cervical: No cervical adenopathy.  Skin:    General: Skin is warm and dry.      Capillary Refill: Capillary refill takes less than 2 seconds.     Coloration: Skin is not pale.     Findings: No erythema or rash.  Neurological:     Mental Status: She is alert and oriented to person, place, and time.     Cranial Nerves: No cranial nerve deficit.     Motor: No abnormal muscle tone.     Coordination: Coordination normal.     Gait: Gait normal.     Deep Tendon Reflexes: Reflexes are normal and symmetric.  Psychiatric:        Mood and Affect: Mood and affect normal.        Behavior: Behavior normal. Behavior is cooperative.        Thought Content: Thought content normal.        Judgment: Judgment normal.        Assessment/Plan: 1. Encounter for routine adult health examination with abnormal findings Age-appropriate preventive screenings and vaccinations discussed, annual physical exam completed. Routine labs for health maintenance ordered, see below. PHM updated.  - Vitamin D (25 hydroxy) - TSH + free T4 - CBC with Differential/Platelet - CMP14+EGFR - Lipid Profile  2. Essential hypertension Continue losartan-HCTZ as prescribed, dose increased. Follow up in 4 weeks. Routine labs ordered - TSH + free T4 - CBC with Differential/Platelet - CMP14+EGFR - Lipid Profile - losartan-hydrochlorothiazide (HYZAAR) 100-12.5 MG tablet; Take 1 tablet by mouth daily.  Dispense: 90 tablet; Refill: 1  3. Mixed hyperlipidemia Routine labs ordered - TSH + free T4 - CMP14+EGFR - Lipid Profile  4. Vitamin D deficiency Routine lab ordered - Vitamin D (25 hydroxy)  5. GAD (generalized anxiety disorder) Stable, Continue hydroxyzine as prescribed.  - hydrOXYzine (ATARAX) 25 MG tablet; Take 1 tablet (25 mg total) by mouth 3 (three) times daily as needed for anxiety.  Dispense: 90 tablet; Refill: 1     General  Counseling: [place] verbalizes understanding of the findings of todays visit and agrees with plan of treatment. I have discussed any further diagnostic evaluation that  may be needed or ordered today. We also reviewed her medications today. she has been encouraged to call the office with any questions or concerns that should arise related to todays visit.    Orders Placed This Encounter  Procedures   Vitamin D (25 hydroxy)   TSH + free T4   CBC with Differential/Platelet   CMP14+EGFR   Lipid Profile    Meds ordered this encounter  Medications   hydrOXYzine (ATARAX) 25 MG tablet    Sig: Take 1 tablet (25 mg total) by mouth 3 (three) times daily as needed for anxiety.    Dispense:  90 tablet    Refill:  1   losartan-hydrochlorothiazide (HYZAAR) 100-12.5 MG tablet    Sig: Take 1 tablet by mouth daily.    Dispense:  90 tablet    Refill:  1    Note increased losartan dose. Discontinue 50-12.5 mg and fill new script today.    Return in about 4 weeks (around 10/21/2022) for F/U, BP check, Zayquan Bogard PCP medication dose adjustment.   Total time spent:30 Minutes Time spent includes review of chart, medications, test results, and follow up plan with the patient.   [place] Controlled Substance Database was reviewed by me.  This patient was seen by Jonetta Osgood, FNP-C in collaboration with Dr. Clayborn Bigness as a part of collaborative care agreement.  Kalim Kissel R. Valetta Fuller, MSN, FNP-C Internal medicine

## 2022-09-24 ENCOUNTER — Telehealth: Payer: Self-pay | Admitting: Nurse Practitioner

## 2022-09-24 NOTE — Telephone Encounter (Signed)
FMLA paperwork faxed back to [place], [place]; [phone]. Scanned-Toni

## 2022-09-27 ENCOUNTER — Telehealth: Payer: Self-pay | Admitting: Internal Medicine

## 2022-09-27 NOTE — Telephone Encounter (Signed)
Corrected copy FMLA paperwork faxed back to [place], [place]; [phone]. Scanned-Toni

## 2022-10-17 ENCOUNTER — Other Ambulatory Visit: Payer: Self-pay | Admitting: Nurse Practitioner

## 2022-10-17 DIAGNOSIS — F411 Generalized anxiety disorder: Secondary | ICD-10-CM

## 2022-10-17 NOTE — Telephone Encounter (Signed)
As per alyssa send

## 2022-10-22 ENCOUNTER — Ambulatory Visit (INDEPENDENT_AMBULATORY_CARE_PROVIDER_SITE_OTHER): Payer: BC Managed Care – PPO | Admitting: Nurse Practitioner

## 2022-10-22 ENCOUNTER — Encounter: Payer: Self-pay | Admitting: Nurse Practitioner

## 2022-10-22 VITALS — BP 145/75 | Temp 98.4°F | Resp 16 | Ht 67.5 in | Wt 253.0 lb

## 2022-10-22 DIAGNOSIS — I10 Essential (primary) hypertension: Secondary | ICD-10-CM

## 2022-10-22 DIAGNOSIS — R600 Localized edema: Secondary | ICD-10-CM

## 2022-10-22 MED ORDER — HYDROCHLOROTHIAZIDE 12.5 MG PO TABS: 12.5000 mg | ORAL_TABLET | Freq: Every day | ORAL | 0 refills | Status: DC

## 2022-10-22 MED ORDER — LOSARTAN POTASSIUM-HCTZ 100-25 MG PO TABS: 1.0000 | ORAL_TABLET | Freq: Every day | ORAL | 2 refills | Status: DC

## 2022-10-22 NOTE — Progress Notes (Signed)
[facility] [place], [place] [address]  Internal MEDICINE  Office Visit Note  Patient Name: Heather Beck  [id number]  [id number]  Date of Service: 10/22/2022  Chief Complaint  Patient presents with   Follow-up   Hypertension    HPI Heather Beck presents for a follow-up visit for Increased losartan dose which has improved the BP some Swelling has improved some but is still there.     Current Medication: Outpatient Encounter Medications as of 10/22/2022  Medication Sig   hydrochlorothiazide (HYDRODIURIL) 12.5 MG tablet Take 1 tablet (12.5 mg total) by mouth daily. Take with losartan-HCTZ.   hydrOXYzine (ATARAX) 25 MG tablet TAKE 1 TABLET BY MOUTH 3 TIMES DAILY AS NEEDED FOR ANXIETY.   [START ON 11/15/2022] losartan-hydrochlorothiazide (HYZAAR) 100-25 MG tablet Take 1 tablet by mouth daily.   triamcinolone cream (KENALOG) 0.1 % Apply 1 application topically 2 (two) times daily.   Vitamin D, Ergocalciferol, (DRISDOL) 1.25 MG (50000 UNIT) CAPS capsule Take 1 capsule (50,000 Units total) by mouth every 7 (seven) days.   [DISCONTINUED] losartan-hydrochlorothiazide (HYZAAR) 100-12.5 MG tablet Take 1 tablet by mouth daily.   No facility-administered encounter medications on file as of 10/22/2022.    Surgical History: Past Surgical History:  Procedure Laterality Date   NO PAST SURGERIES      Medical History: Past Medical History:  Diagnosis Date   Anxiety    Chicken pox    Hypertension     Family History: Family History  Problem Relation Age of Onset   Pancreatic cancer Maternal Aunt    Bladder Cancer Maternal Uncle     Social History   Socioeconomic History   Marital status: Single    Spouse name: Not on file   Number of children: Not on file   Years of education: Not on file   Highest education level: Not on file  Occupational History   Not on file  Tobacco Use   Smoking status: Former    Types: Cigarettes    Quit date: 05/02/2017     Years since quitting: 5.4   Smokeless tobacco: Never  Vaping Use   Vaping Use: Never used  Substance and Sexual Activity   Alcohol use: Yes    Alcohol/week: 3.0 standard drinks of alcohol    Types: 3 Glasses of wine per week   Drug use: No   Sexual activity: Not Currently  Other Topics Concern   Not on file  Social History Narrative   Not on file   Social Determinants of Health   Financial Resource Strain: Not on file  Food Insecurity: Not on file  Transportation Needs: Not on file  Physical Activity: Not on file  Stress: Not on file  Social Connections: Not on file  Intimate Partner Violence: Not on file      Review of Systems  Vital Signs: BP (!) 145/75 Comment: 166/95  Temp 98.4 F (36.9 C)   Resp 16   Ht 5' 7.5" (1.715 m)   Wt 253 lb (114.8 kg)   SpO2 99%   BMI 39.04 kg/m    Physical Exam     Assessment/Plan:   General Counseling: Heather Beck verbalizes understanding of the findings of todays visit and agrees with plan of treatment. I have discussed any further diagnostic evaluation that may be needed or ordered today. We also reviewed her medications today. she has been encouraged to call the office with any questions or concerns that should arise related to todays visit.    No  orders of the defined types were placed in this encounter.   Meds ordered this encounter  Medications   hydrochlorothiazide (HYDRODIURIL) 12.5 MG tablet    Sig: Take 1 tablet (12.5 mg total) by mouth daily. Take with losartan-HCTZ.    Dispense:  30 tablet    Refill:  0    Add to losartan-HCTZ for 1 month then will switch to the combo pill. Fill today   losartan-hydrochlorothiazide (HYZAAR) 100-25 MG tablet    Sig: Take 1 tablet by mouth daily.    Dispense:  90 tablet    Refill:  2    Discontinue 100-12.5 dose, starting in April, the dose will increase to 100-25 mg    Return in about 4 weeks (around 11/19/2022) for F/U, BP check, Heather Beck PCP but if everything is good, may  cancel appointment. .   Total time spent:*** Minutes Time spent includes review of chart, medications, test results, and follow up plan with the patient.   [place] Controlled Substance Database was reviewed by me.  This patient was seen by Jonetta Osgood, FNP-C in collaboration with Dr. Clayborn Bigness as a part of collaborative care agreement.   Patrick Sohm R. Valetta Fuller, MSN, FNP-C Internal medicine

## 2022-11-21 ENCOUNTER — Ambulatory Visit: Payer: BC Managed Care – PPO | Admitting: Nurse Practitioner

## 2022-11-22 ENCOUNTER — Other Ambulatory Visit: Payer: Self-pay | Admitting: Nurse Practitioner

## 2022-11-22 DIAGNOSIS — R600 Localized edema: Secondary | ICD-10-CM

## 2022-11-22 DIAGNOSIS — I10 Essential (primary) hypertension: Secondary | ICD-10-CM

## 2022-12-06 ENCOUNTER — Telehealth: Payer: Self-pay | Admitting: Nurse Practitioner

## 2022-12-06 NOTE — Telephone Encounter (Signed)
Lvm to re-sch no show appt-nm 

## 2023-06-11 ENCOUNTER — Other Ambulatory Visit: Payer: Self-pay | Admitting: Nurse Practitioner

## 2023-06-11 DIAGNOSIS — I10 Essential (primary) hypertension: Secondary | ICD-10-CM

## 2023-06-11 DIAGNOSIS — R600 Localized edema: Secondary | ICD-10-CM

## 2023-08-10 ENCOUNTER — Other Ambulatory Visit: Payer: Self-pay | Admitting: Nurse Practitioner

## 2023-08-10 DIAGNOSIS — R600 Localized edema: Secondary | ICD-10-CM

## 2023-08-10 DIAGNOSIS — I10 Essential (primary) hypertension: Secondary | ICD-10-CM

## 2023-08-11 NOTE — Telephone Encounter (Signed)
 Please review

## 2023-09-17 ENCOUNTER — Telehealth: Payer: Self-pay | Admitting: Nurse Practitioner

## 2023-09-17 NOTE — Telephone Encounter (Signed)
Left vm and sent mychart message to confirm 09/25/23 appointment-Toni

## 2023-09-25 ENCOUNTER — Encounter: Payer: BC Managed Care – PPO | Admitting: Nurse Practitioner

## 2023-10-06 ENCOUNTER — Telehealth: Payer: Self-pay | Admitting: Nurse Practitioner

## 2023-10-06 ENCOUNTER — Ambulatory Visit (INDEPENDENT_AMBULATORY_CARE_PROVIDER_SITE_OTHER): Payer: BC Managed Care – PPO | Admitting: Nurse Practitioner

## 2023-10-06 ENCOUNTER — Encounter: Payer: Self-pay | Admitting: Nurse Practitioner

## 2023-10-06 VITALS — BP 132/65 | HR 98 | Temp 98.3°F | Resp 16 | Ht 67.5 in | Wt 239.8 lb

## 2023-10-06 DIAGNOSIS — Z0001 Encounter for general adult medical examination with abnormal findings: Secondary | ICD-10-CM | POA: Diagnosis not present

## 2023-10-06 DIAGNOSIS — R2242 Localized swelling, mass and lump, left lower limb: Secondary | ICD-10-CM

## 2023-10-06 DIAGNOSIS — R600 Localized edema: Secondary | ICD-10-CM | POA: Diagnosis not present

## 2023-10-06 DIAGNOSIS — E782 Mixed hyperlipidemia: Secondary | ICD-10-CM | POA: Diagnosis not present

## 2023-10-06 DIAGNOSIS — E559 Vitamin D deficiency, unspecified: Secondary | ICD-10-CM

## 2023-10-06 DIAGNOSIS — I10 Essential (primary) hypertension: Secondary | ICD-10-CM | POA: Diagnosis not present

## 2023-10-06 DIAGNOSIS — F411 Generalized anxiety disorder: Secondary | ICD-10-CM

## 2023-10-06 NOTE — Telephone Encounter (Signed)
 FMA paperwork completed. Faxed to [place]; [phone]. Scanned. Notified patient, she stated since I faxed, she does not need original-Heather Beck

## 2023-10-06 NOTE — Progress Notes (Signed)
 [facility] [address] [place], [place] [address]  Internal MEDICINE  Office Visit Note  Patient Name: Heather Beck  [id number]  [id number]  Date of Service: 10/06/2023  Chief Complaint  Patient presents with   Hypertension   Annual Exam    HPI [name] presents for an annual well visit and physical exam.  Well-appearing 38 y.o. female with anxiety, low back pain and hypertension  Routine mammogram: due in 2 more years  Pap smear: due after November this year  Labs: due for routine labs  New or worsening pain: none  Other concerns: lump on back of left lower leg, painless but firm has paperwork to renew intermittent FMLA.    Current Medication: Outpatient Encounter Medications as of 10/06/2023  Medication Sig   hydrochlorothiazide (HYDRODIURIL) 12.5 MG tablet TAKE 1 TABLET BY MOUTH DAILY. TAKE WITH LOSARTAN-HCTZ.   hydrOXYzine (ATARAX) 25 MG tablet TAKE 1 TABLET BY MOUTH 3 TIMES DAILY AS NEEDED FOR ANXIETY.   losartan-hydrochlorothiazide (HYZAAR) 100-25 MG tablet TAKE 1 TABLET BY MOUTH EVERY DAY   triamcinolone cream (KENALOG) 0.1 % Apply 1 application topically 2 (two) times daily.   Vitamin D, Ergocalciferol, (DRISDOL) 1.25 MG (50000 UNIT) CAPS capsule Take 1 capsule (50,000 Units total) by mouth every 7 (seven) days.   No facility-administered encounter medications on file as of 10/06/2023.    Surgical History: Past Surgical History:  Procedure Laterality Date   NO PAST SURGERIES      Medical History: Past Medical History:  Diagnosis Date   Anxiety    Chicken pox    Hypertension     Family History: Family History  Problem Relation Age of Onset   Pancreatic cancer Maternal Aunt    Bladder Cancer Maternal Uncle     Social History   Socioeconomic History   Marital status: Single    Spouse name: Not on file   Number of children: Not on file   Years of education: Not on file   Highest education level: Not on file  Occupational History   Not  on file  Tobacco Use   Smoking status: Former    Current packs/day: 0.00    Types: Cigarettes    Quit date: 05/02/2017    Years since quitting: 6.4   Smokeless tobacco: Never  Vaping Use   Vaping status: Never Used  Substance and Sexual Activity   Alcohol use: Yes    Alcohol/week: 3.0 standard drinks of alcohol    Types: 3 Glasses of wine per week   Drug use: No   Sexual activity: Not Currently  Other Topics Concern   Not on file  Social History Narrative   Not on file   Social Drivers of Health   Financial Resource Strain: Not on file  Food Insecurity: Not on file  Transportation Needs: Not on file  Physical Activity: Not on file  Stress: Not on file  Social Connections: Not on file  Intimate Partner Violence: Not on file      Review of Systems  Constitutional:  Negative for activity change, appetite change, chills, fatigue, fever and unexpected weight change.  HENT: Negative.  Negative for congestion, ear pain, rhinorrhea, sore throat and trouble swallowing.   Eyes: Negative.   Respiratory: Negative.  Negative for cough, chest tightness, shortness of breath and wheezing.   Cardiovascular: Negative.  Negative for chest pain.  Gastrointestinal: Negative.  Negative for abdominal pain, blood in stool, constipation, diarrhea, nausea and vomiting.  Endocrine: Negative.   Genitourinary: Negative.  Negative for difficulty urinating, dysuria, frequency, hematuria and urgency.  Musculoskeletal: Negative.  Negative for arthralgias, back pain, joint swelling, myalgias and neck pain.  Skin: Negative.  Negative for rash and wound.       Lump on back of lower left leg  Allergic/Immunologic: Negative.  Negative for immunocompromised state.  Neurological: Negative.  Negative for dizziness, seizures, numbness and headaches.  Hematological: Negative.   Psychiatric/Behavioral: Negative.  Negative for behavioral problems, self-injury and suicidal ideas. The patient is not  nervous/anxious.     Vital Signs: BP 132/65   Pulse 98   Temp 98.3 F (36.8 C)   Resp 16   Ht 5' 7.5" (1.715 m)   Wt 239 lb 12.8 oz (108.8 kg)   SpO2 98%   BMI 37.00 kg/m    Physical Exam Vitals reviewed.  Constitutional:      General: She is awake. She is not in acute distress.    Appearance: Normal appearance. She is well-developed and well-groomed. She is obese. She is not ill-appearing or diaphoretic.  HENT:     Head: Normocephalic and atraumatic.     Right Ear: Tympanic membrane, ear canal and external ear normal.     Left Ear: Tympanic membrane, ear canal and external ear normal.     Nose: Nose normal. No congestion or rhinorrhea.     Mouth/Throat:     Lips: Pink.     Mouth: Mucous membranes are moist.     Pharynx: Oropharynx is clear. Uvula midline. No oropharyngeal exudate or posterior oropharyngeal erythema.  Eyes:     General: Lids are normal. Vision grossly intact. Gaze aligned appropriately. No scleral icterus.       Right eye: No discharge.        Left eye: No discharge.     Conjunctiva/sclera: Conjunctivae normal.     Pupils: Pupils are equal, round, and reactive to light.     Funduscopic exam:    Right eye: Red reflex present.        Left eye: Red reflex present. Neck:     Thyroid: No thyromegaly.     Vascular: No JVD.     Trachea: Trachea and phonation normal. No tracheal deviation.  Cardiovascular:     Rate and Rhythm: Normal rate and regular rhythm.     Pulses: Normal pulses.     Heart sounds: Normal heart sounds, S1 normal and S2 normal. No murmur heard.    No friction rub. No gallop.  Pulmonary:     Effort: Pulmonary effort is normal. No accessory muscle usage or respiratory distress.     Breath sounds: Normal breath sounds and air entry. No stridor. No wheezing or rales.  Chest:     Chest wall: No tenderness.     Comments: Declined clinical breast exam Abdominal:     General: Bowel sounds are normal. There is no distension.     Palpations:  Abdomen is soft. There is no shifting dullness, fluid wave, mass or pulsatile mass.     Tenderness: There is no abdominal tenderness. There is no guarding or rebound.  Musculoskeletal:        General: No tenderness or deformity. Normal range of motion.     Cervical back: Normal range of motion and neck supple.  Lymphadenopathy:     Cervical: No cervical adenopathy.  Skin:    General: Skin is warm and dry.     Capillary Refill: Capillary refill takes less than 2 seconds.     Coloration: Skin  is not pale.     Findings: No erythema or rash.     Comments: Small firm lump on back of lower left leg, painless and nontender.   Neurological:     Mental Status: She is alert and oriented to person, place, and time.     Cranial Nerves: No cranial nerve deficit.     Motor: No abnormal muscle tone.     Coordination: Coordination normal.     Gait: Gait normal.     Deep Tendon Reflexes: Reflexes are normal and symmetric.  Psychiatric:        Mood and Affect: Mood and affect normal.        Behavior: Behavior normal. Behavior is cooperative.        Thought Content: Thought content normal.        Judgment: Judgment normal.        Assessment/Plan: 1. Encounter for routine adult health examination with abnormal findings (Primary) Age-appropriate preventive screenings and vaccinations discussed, annual physical exam completed. Routine labs for health maintenance ordered, see below. PHM updated.   - CBC with Differential/Platelet - CMP14+EGFR - Lipid Profile - Vitamin D (25 hydroxy)  2. Essential hypertension Stable, continue medication as prescribed.   3. Localized swelling, mass, or lump of lower extremity, left Ultrasound ordered, follow up for results  - [place] LT LOWER EXTREM LTD SOFT TISSUE NON VASCULAR; Future  4. Bilateral lower extremity edema Routine labs ordered  - CBC with Differential/Platelet - CMP14+EGFR - Lipid Profile  5. Mixed hyperlipidemia Routine labs ordered  - CBC  with Differential/Platelet - CMP14+EGFR - Lipid Profile  6. Vitamin D deficiency Routine labs ordered  - Vitamin D (25 hydroxy)  7. GAD (generalized anxiety disorder) Renewal paperwork for intermittent FMLA for GAD.      General Counseling: [name] verbalizes understanding of the findings of todays visit and agrees with plan of treatment. I have discussed any further diagnostic evaluation that may be needed or ordered today. We also reviewed her medications today. she has been encouraged to call the office with any questions or concerns that should arise related to todays visit.    Orders Placed This Encounter  Procedures   [place] LT LOWER EXTREM LTD SOFT TISSUE NON VASCULAR   CBC with Differential/Platelet   CMP14+EGFR   Lipid Profile   Vitamin D (25 hydroxy)    No orders of the defined types were placed in this encounter.   Return in about 1 year (around 10/05/2024) for CPE, Nini Cavan PCP and need follow up for ultrasound results. .   Total time spent:30 Minutes Time spent includes review of chart, medications, test results, and follow up plan with the patient.   [place] Controlled Substance Database was reviewed by me.  This patient was seen by Sallyanne Kuster, FNP-C in collaboration with Dr. Beverely Risen as a part of collaborative care agreement.  Alyce Inscore R. Tedd Sias, MSN, FNP-C Internal medicine

## 2023-10-15 ENCOUNTER — Ambulatory Visit
Admission: RE | Admit: 2023-10-15 | Discharge: 2023-10-15 | Disposition: A | Discharge status: Home / Self Care | Source: Ambulatory Visit | Attending: Nurse Practitioner | Admitting: Nurse Practitioner

## 2023-10-15 DIAGNOSIS — R2242 Localized swelling, mass and lump, left lower limb: Secondary | ICD-10-CM

## 2023-10-24 ENCOUNTER — Other Ambulatory Visit: Payer: Self-pay | Admitting: Nurse Practitioner

## 2023-10-24 DIAGNOSIS — R600 Localized edema: Secondary | ICD-10-CM

## 2023-10-24 DIAGNOSIS — I10 Essential (primary) hypertension: Secondary | ICD-10-CM

## 2023-11-04 DIAGNOSIS — Z0001 Encounter for general adult medical examination with abnormal findings: Secondary | ICD-10-CM | POA: Diagnosis not present

## 2023-11-04 DIAGNOSIS — E782 Mixed hyperlipidemia: Secondary | ICD-10-CM | POA: Diagnosis not present

## 2023-11-04 DIAGNOSIS — R600 Localized edema: Secondary | ICD-10-CM | POA: Diagnosis not present

## 2023-11-04 DIAGNOSIS — E559 Vitamin D deficiency, unspecified: Secondary | ICD-10-CM | POA: Diagnosis not present

## 2023-11-05 LAB — LIPID PANEL
Chol/HDL Ratio: 2.5 ratio (ref 0.0–4.4)
Cholesterol, Total: 211 mg/dL — ABNORMAL HIGH (ref 100–199)
HDL: 84 mg/dL (ref >39)
LDL Chol Calc (NIH): 111 mg/dL — ABNORMAL HIGH (ref 0–99)
Triglycerides: 92 mg/dL (ref 0–149)
VLDL Cholesterol Cal: 16 mg/dL (ref 5–40)

## 2023-11-05 LAB — VITAMIN D 25 HYDROXY (VIT D DEFICIENCY, FRACTURES): Vit D, 25-Hydroxy: 20.6 ng/mL — ABNORMAL LOW (ref 30.0–100.0)

## 2023-11-05 LAB — CBC WITH DIFFERENTIAL/PLATELET
Basophils Absolute: 0.1 10*3/uL (ref 0.0–0.2)
Basos: 1 %
EOS (ABSOLUTE): 0.3 10*3/uL (ref 0.0–0.4)
Eos: 3 %
Hematocrit: 38.4 % (ref 34.0–46.6)
Hemoglobin: 12.6 g/dL (ref 11.1–15.9)
Immature Grans (Abs): 0 10*3/uL (ref 0.0–0.1)
Immature Granulocytes: 0 %
Lymphocytes Absolute: 3.7 10*3/uL — ABNORMAL HIGH (ref 0.7–3.1)
Lymphs: 41 %
MCH: 29.8 pg (ref 26.6–33.0)
MCHC: 32.8 g/dL (ref 31.5–35.7)
MCV: 91 fL (ref 79–97)
Monocytes Absolute: 0.5 10*3/uL (ref 0.1–0.9)
Monocytes: 6 %
Neutrophils Absolute: 4.4 10*3/uL (ref 1.4–7.0)
Neutrophils: 49 %
Platelets: 375 10*3/uL (ref 150–450)
RBC: 4.23 x10E6/uL (ref 3.77–5.28)
RDW: 13.1 % (ref 11.7–15.4)
WBC: 9.1 10*3/uL (ref 3.4–10.8)

## 2023-11-05 LAB — CMP14+EGFR
ALT: 45 IU/L — ABNORMAL HIGH (ref 0–32)
AST: 35 IU/L (ref 0–40)
Albumin: 4.3 g/dL (ref 3.9–4.9)
Alkaline Phosphatase: 108 IU/L (ref 44–121)
BUN/Creatinine Ratio: 18 (ref 9–23)
BUN: 17 mg/dL (ref 6–20)
Bilirubin Total: 0.2 mg/dL (ref 0.0–1.2)
CO2: 24 mmol/L (ref 20–29)
Calcium: 9.5 mg/dL (ref 8.7–10.2)
Chloride: 101 mmol/L (ref 96–106)
Creatinine, Ser: 0.92 mg/dL (ref 0.57–1.00)
Globulin, Total: 3.2 g/dL (ref 1.5–4.5)
Glucose: 95 mg/dL (ref 70–99)
Potassium: 4.6 mmol/L (ref 3.5–5.2)
Sodium: 139 mmol/L (ref 134–144)
Total Protein: 7.5 g/dL (ref 6.0–8.5)
eGFR: 82 mL/min/{1.73_m2} (ref >59)

## 2024-01-06 ENCOUNTER — Other Ambulatory Visit: Payer: Self-pay | Admitting: Nurse Practitioner

## 2024-01-06 DIAGNOSIS — R600 Localized edema: Secondary | ICD-10-CM

## 2024-01-06 DIAGNOSIS — I10 Essential (primary) hypertension: Secondary | ICD-10-CM

## 2024-06-05 ENCOUNTER — Other Ambulatory Visit: Payer: Self-pay | Admitting: Nurse Practitioner

## 2024-06-05 DIAGNOSIS — I10 Essential (primary) hypertension: Secondary | ICD-10-CM

## 2024-06-05 DIAGNOSIS — R600 Localized edema: Secondary | ICD-10-CM

## 2024-10-06 ENCOUNTER — Other Ambulatory Visit: Admitting: Nurse Practitioner
